# Patient Record
Sex: Female | Born: 1970
Health system: Southern US, Community
[De-identification: ages and names within clinical notes are randomized; demographics above are authoritative.]

---

## 2017-09-14 DIAGNOSIS — R001 Bradycardia, unspecified: Secondary | ICD-10-CM | POA: Diagnosis not present

## 2017-09-14 DIAGNOSIS — R0789 Other chest pain: Secondary | ICD-10-CM | POA: Diagnosis not present

## 2017-09-14 DIAGNOSIS — R002 Palpitations: Secondary | ICD-10-CM | POA: Diagnosis not present

## 2018-05-25 DIAGNOSIS — M7652 Patellar tendinitis, left knee: Secondary | ICD-10-CM | POA: Diagnosis not present

## 2018-05-25 DIAGNOSIS — M1712 Unilateral primary osteoarthritis, left knee: Secondary | ICD-10-CM | POA: Diagnosis not present

## 2018-11-05 DIAGNOSIS — Z131 Encounter for screening for diabetes mellitus: Secondary | ICD-10-CM | POA: Diagnosis not present

## 2018-11-05 DIAGNOSIS — M47814 Spondylosis without myelopathy or radiculopathy, thoracic region: Secondary | ICD-10-CM | POA: Diagnosis not present

## 2018-11-05 DIAGNOSIS — Z5320 Procedure and treatment not carried out because of patient's decision for unspecified reasons: Secondary | ICD-10-CM | POA: Diagnosis not present

## 2018-11-05 DIAGNOSIS — Z5181 Encounter for therapeutic drug level monitoring: Secondary | ICD-10-CM | POA: Diagnosis not present

## 2018-11-05 DIAGNOSIS — Z13220 Encounter for screening for lipoid disorders: Secondary | ICD-10-CM | POA: Diagnosis not present

## 2018-11-05 DIAGNOSIS — Z1159 Encounter for screening for other viral diseases: Secondary | ICD-10-CM | POA: Diagnosis not present

## 2018-11-05 DIAGNOSIS — M542 Cervicalgia: Secondary | ICD-10-CM | POA: Diagnosis not present

## 2018-11-05 DIAGNOSIS — M546 Pain in thoracic spine: Secondary | ICD-10-CM | POA: Diagnosis not present

## 2018-11-05 DIAGNOSIS — Z79899 Other long term (current) drug therapy: Secondary | ICD-10-CM | POA: Diagnosis not present

## 2018-11-05 DIAGNOSIS — M85611 Other cyst of bone, right shoulder: Secondary | ICD-10-CM | POA: Diagnosis not present

## 2018-11-11 DIAGNOSIS — M47816 Spondylosis without myelopathy or radiculopathy, lumbar region: Secondary | ICD-10-CM | POA: Diagnosis not present

## 2018-11-11 DIAGNOSIS — M9901 Segmental and somatic dysfunction of cervical region: Secondary | ICD-10-CM | POA: Diagnosis not present

## 2018-11-11 DIAGNOSIS — M7918 Myalgia, other site: Secondary | ICD-10-CM | POA: Diagnosis not present

## 2018-11-11 DIAGNOSIS — M542 Cervicalgia: Secondary | ICD-10-CM | POA: Diagnosis not present

## 2018-12-14 DIAGNOSIS — M79601 Pain in right arm: Secondary | ICD-10-CM | POA: Diagnosis not present

## 2018-12-14 DIAGNOSIS — M542 Cervicalgia: Secondary | ICD-10-CM | POA: Diagnosis not present

## 2018-12-14 DIAGNOSIS — S46001S Unspecified injury of muscle(s) and tendon(s) of the rotator cuff of right shoulder, sequela: Secondary | ICD-10-CM | POA: Diagnosis not present

## 2018-12-14 DIAGNOSIS — Z1239 Encounter for other screening for malignant neoplasm of breast: Secondary | ICD-10-CM | POA: Diagnosis not present

## 2018-12-17 DIAGNOSIS — M7541 Impingement syndrome of right shoulder: Secondary | ICD-10-CM | POA: Diagnosis not present

## 2018-12-31 DIAGNOSIS — G8929 Other chronic pain: Secondary | ICD-10-CM | POA: Diagnosis not present

## 2018-12-31 DIAGNOSIS — M542 Cervicalgia: Secondary | ICD-10-CM | POA: Diagnosis not present

## 2018-12-31 DIAGNOSIS — M25511 Pain in right shoulder: Secondary | ICD-10-CM | POA: Diagnosis not present

## 2019-04-22 DIAGNOSIS — M542 Cervicalgia: Secondary | ICD-10-CM | POA: Diagnosis not present

## 2019-04-22 DIAGNOSIS — M25511 Pain in right shoulder: Secondary | ICD-10-CM | POA: Diagnosis not present

## 2019-04-22 DIAGNOSIS — G8929 Other chronic pain: Secondary | ICD-10-CM | POA: Diagnosis not present

## 2019-06-02 ENCOUNTER — Ambulatory Visit: Payer: Self-pay | Admitting: Sports Medicine

## 2019-06-04 DIAGNOSIS — K080 Exfoliation of teeth due to systemic causes: Secondary | ICD-10-CM | POA: Diagnosis not present

## 2019-06-07 ENCOUNTER — Ambulatory Visit: Payer: Self-pay | Admitting: Family Medicine

## 2019-06-10 ENCOUNTER — Ambulatory Visit: Payer: Self-pay | Admitting: Pediatrics

## 2019-06-11 ENCOUNTER — Ambulatory Visit (INDEPENDENT_AMBULATORY_CARE_PROVIDER_SITE_OTHER): Payer: Federal, State, Local not specified - PPO | Admitting: Sports Medicine

## 2019-06-11 ENCOUNTER — Encounter: Payer: Self-pay | Admitting: Sports Medicine

## 2019-06-11 ENCOUNTER — Other Ambulatory Visit: Payer: Self-pay

## 2019-06-11 ENCOUNTER — Ambulatory Visit
Admission: RE | Admit: 2019-06-11 | Discharge: 2019-06-11 | Disposition: A | Discharge status: Home / Self Care | Payer: Federal, State, Local not specified - PPO | Source: Ambulatory Visit | Attending: Family Medicine | Admitting: Family Medicine

## 2019-06-11 VITALS — BP 128/86 | Ht 69.0 in | Wt 220.0 lb

## 2019-06-11 DIAGNOSIS — M7581 Other shoulder lesions, right shoulder: Secondary | ICD-10-CM

## 2019-06-11 DIAGNOSIS — M25561 Pain in right knee: Secondary | ICD-10-CM

## 2019-06-11 DIAGNOSIS — M25562 Pain in left knee: Secondary | ICD-10-CM

## 2019-06-11 DIAGNOSIS — M1712 Unilateral primary osteoarthritis, left knee: Secondary | ICD-10-CM | POA: Diagnosis not present

## 2019-06-11 MED ORDER — DOCUSATE SODIUM 100 MG PO CAPS: 100.0000 mg | ORAL_CAPSULE | Freq: Two times a day (BID) | ORAL | 0 refills | Status: AC | PRN

## 2019-06-11 MED ORDER — MELOXICAM 15 MG PO TABS: 15.0000 mg | ORAL_TABLET | Freq: Every day | ORAL | 2 refills | Status: DC | PRN

## 2019-06-11 NOTE — Patient Instructions (Addendum)
Your shoulder pain is caused by tendinitis of your rotator cuff -Referrals been sent to physical therapy so he can work on your shoulder strength and range of motion -I have sent a prescription for meloxicam.  Take this every day, once a day for 2 weeks.  After this you may take it daily as needed.  Do not take it with other anti-inflammatories such as ibuprofen or Aleve -If you would like a steroid injection in your shoulder in the future, please let me know.  This will help with some of the nighttime pain you are getting as well as some of the pain you are getting with movement.  Your knee pain is likely caused by arthritis of the knee.  I can see signs of some arthritis on the ultrasound -We will obtain x-rays of your knee to evaluate help at the arthritis is -You may take the meloxicam as described above -The physical therapist will also work with you for your knee pain -We can consider corticosteroid injections of your knees in the future if you desire them  I will see back in 1 month

## 2019-06-11 NOTE — Addendum Note (Signed)
Addended by: Christena Deem on: 06/11/2019 08:23 PM   Modules accepted: Orders

## 2019-06-11 NOTE — Progress Notes (Signed)
PCP: No primary care provider on file.  Subjective:   HPI: Patient is a 49 y.o. female here for evaluation of right shoulder pain and bilateral knee pain.  Patient notes right shoulder pain is been present for the last several months.  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  Patient notes the pain is in her lateral shoulder does not radiate.  Pain with reaching her arm above her head or behind her back.  Her last doctor gave her a prescription for physical therapy however she was only able to go to 1 visit before she had to move.  She recently moved from [place] to [place].  Patient is also complaining of bilateral knee pain.  Pain is located diffusely throughout the knee.  She notes pain especially with going downstairs or with doing deep squats.  She has a history of left ACL repair and left knee meniscectomy.  She denies any mechanical symptoms but notes occasionally the knee will feel unstable and will give out.  Patient denies any injury or trauma to her knees.   Review of Systems: See HPI above.  History reviewed. No pertinent past medical history.  No current outpatient medications on file prior to visit.   No current facility-administered medications on file prior to visit.    History reviewed. No pertinent surgical history.  Not on File  Social History   Socioeconomic History  . Marital status: Married    Spouse name: Not on file  . Number of children: Not on file  . Years of education: Not on file  . Highest education level: Not on file  Occupational History  . Not on file  Tobacco Use  . Smoking status: Not on file  Substance and Sexual Activity  . Alcohol use: Not on file  . Drug use: Not on file  . Sexual activity: Not on file  Other Topics Concern  . Not on file  Social History Narrative  . Not on file   Social Determinants of Health   Financial Resource Strain:   .  Difficulty of Paying Living Expenses: Not on file  Food Insecurity:   . Worried About Charity fundraiser in the Last Year: Not on file  . Ran Out of Food in the Last Year: Not on file  Transportation Needs:   . Lack of Transportation (Medical): Not on file  . Lack of Transportation (Non-Medical): Not on file  Physical Activity:   . Days of Exercise per Week: Not on file  . Minutes of Exercise per Session: Not on file  Stress:   . Feeling of Stress : Not on file  Social Connections:   . Frequency of Communication with Friends and Family: Not on file  . Frequency of Social Gatherings with Friends and Family: Not on file  . Attends Religious Services: Not on file  . Active Member of Clubs or Organizations: Not on file  . Attends Archivist Meetings: Not on file  . Marital Status: Not on file  Intimate Partner Violence:   . Fear of Current or Ex-Partner: Not on file  . Emotionally Abused: Not on file  . Physically Abused: Not on file  . Sexually Abused: Not on file    History reviewed. No pertinent family history.      Objective:  Physical Exam: There were no vitals taken for this visit. Gen: NAD, comfortable in exam room  Lungs: Breathing comfortably on room air Shoulder Exam Right -Inspection: No discoloration, no deformity -Palpation: No tenderness to palpation -ROM (active): Abduction: 180 degrees; Forward Flexion: 180 degrees; Internal Rotation: T10 -Strength: Abduction: 4/5; Forward Flexion: 5/5; Internal Rotation: 5/5; External Rotation: 5/5 -Special Tests: Hawkins: Positive; Neers: Positive; Jobs: Positive; O'briens: Negative; Speeds: Negative; Yergasons: Negative -Limb neurovascularly intact, no instability noted  Knee Exam Left -Inspection: no deformity, no discoloration -Palpation: medial joint line: Non-tender; lateral joint line: non-tender; quad tendon: non-tender; patella: non-tender; patellar tendon: non-tendon -ROM: Extension: 0 degrees; Flexion:  110 degrees -Strength: Extension: 5/5; Flexion: 5/5 -Special Tests: Varus Stress: Negative; Valgus Stress: Negative; Lachman: Negative; Posterior drawer: Negative; McMurray: Negative  Contralateral Knee -Inspection: no deformity, no discoloration -Palpation: medial joint line: Non-tender; lateral joint line: non-tender; quad tendon: non-tender; patella: non-tender; patellar tendon: non-tendon -ROM: Extension: 0 degrees; Flexion: 110 degrees -Strength: Extension: 5/5; Flexion: 5/5 -Special Tests: Varus Stress: Negative; Valgus Stress: Negative; Lachman: Negative; Posterior drawer: Negative; McMurray: Negative  Limited diagnostic ultrasound of the left knee Findings: -Normal appearance of the quadriceps and patellar tendon -Normal appearance of the patella -No fluid noted within the suprapatellar pouch -Degenerative changes seen along the medial lateral joint lines. -Normal appearance of the medial lateral meniscus Impression: -Also findings consistent with mild to moderate arthritis   Assessment & Plan:  Patient is a 49 y.o. female here for evaluation of right shoulder pain and bilateral knee pain  1.  Right rotator cuff tendinitis -Patient will be referred to physical therapy -Patient given a prescription for meloxicam.  She will take this every day for 2 weeks and then take it daily as needed after that.  She will not take other anti-inflammatories such as I Profen or Aleve with this -Patient declined steroid injection today.  She may consider this again in the future  2.  Bilateral knee osteoarthritis -We will obtain x-rays to evaluate how bad her arthritis is -Patient was offered corticosteroid injection in her knees however she declined this today -Physical therapy as above -Meloxicam as above  -Patient was given a note for work excusing her from ladders and stairs until her next visit  Patient will follow up in 1 month

## 2019-06-15 ENCOUNTER — Encounter: Payer: Self-pay | Admitting: Family Medicine

## 2019-06-15 DIAGNOSIS — R293 Abnormal posture: Secondary | ICD-10-CM | POA: Diagnosis not present

## 2019-06-15 DIAGNOSIS — M25511 Pain in right shoulder: Secondary | ICD-10-CM | POA: Diagnosis not present

## 2019-06-15 DIAGNOSIS — M25561 Pain in right knee: Secondary | ICD-10-CM | POA: Diagnosis not present

## 2019-06-15 DIAGNOSIS — M25562 Pain in left knee: Secondary | ICD-10-CM | POA: Diagnosis not present

## 2019-06-17 DIAGNOSIS — M25562 Pain in left knee: Secondary | ICD-10-CM | POA: Diagnosis not present

## 2019-06-17 DIAGNOSIS — M25561 Pain in right knee: Secondary | ICD-10-CM | POA: Diagnosis not present

## 2019-06-17 DIAGNOSIS — R293 Abnormal posture: Secondary | ICD-10-CM | POA: Diagnosis not present

## 2019-06-17 DIAGNOSIS — M25511 Pain in right shoulder: Secondary | ICD-10-CM | POA: Diagnosis not present

## 2019-06-22 DIAGNOSIS — M25561 Pain in right knee: Secondary | ICD-10-CM | POA: Diagnosis not present

## 2019-06-22 DIAGNOSIS — M25511 Pain in right shoulder: Secondary | ICD-10-CM | POA: Diagnosis not present

## 2019-06-22 DIAGNOSIS — M25562 Pain in left knee: Secondary | ICD-10-CM | POA: Diagnosis not present

## 2019-06-22 DIAGNOSIS — R293 Abnormal posture: Secondary | ICD-10-CM | POA: Diagnosis not present

## 2019-07-08 DIAGNOSIS — R293 Abnormal posture: Secondary | ICD-10-CM | POA: Diagnosis not present

## 2019-07-08 DIAGNOSIS — M25561 Pain in right knee: Secondary | ICD-10-CM | POA: Diagnosis not present

## 2019-07-08 DIAGNOSIS — M25511 Pain in right shoulder: Secondary | ICD-10-CM | POA: Diagnosis not present

## 2019-07-08 DIAGNOSIS — M25562 Pain in left knee: Secondary | ICD-10-CM | POA: Diagnosis not present

## 2019-07-14 ENCOUNTER — Ambulatory Visit: Payer: Federal, State, Local not specified - PPO | Admitting: Sports Medicine

## 2019-08-04 ENCOUNTER — Encounter: Payer: Self-pay | Admitting: Sports Medicine

## 2019-08-04 ENCOUNTER — Ambulatory Visit (INDEPENDENT_AMBULATORY_CARE_PROVIDER_SITE_OTHER): Payer: Federal, State, Local not specified - PPO | Admitting: Sports Medicine

## 2019-08-04 ENCOUNTER — Other Ambulatory Visit: Payer: Self-pay

## 2019-08-04 VITALS — BP 124/82 | Ht 69.0 in | Wt 220.0 lb

## 2019-08-04 DIAGNOSIS — M170 Bilateral primary osteoarthritis of knee: Secondary | ICD-10-CM | POA: Diagnosis not present

## 2019-08-04 DIAGNOSIS — M7581 Other shoulder lesions, right shoulder: Secondary | ICD-10-CM | POA: Diagnosis not present

## 2019-08-04 DIAGNOSIS — M7582 Other shoulder lesions, left shoulder: Secondary | ICD-10-CM

## 2019-08-04 MED ORDER — METHYLPREDNISOLONE ACETATE 40 MG/ML IJ SUSP
40.0000 mg | Freq: Once | INTRAMUSCULAR | Status: AC
  Administered 2019-08-04: 16:00:00 40 mg via INTRA_ARTICULAR

## 2019-08-04 MED ORDER — NITROGLYCERIN 0.2 MG/HR TD PT24: MEDICATED_PATCH | TRANSDERMAL | 1 refills | Status: AC

## 2019-08-04 NOTE — Addendum Note (Signed)
Addended by: Rutha Bouchard E on: 08/04/2019 04:08 PM   Modules accepted: Orders

## 2019-08-04 NOTE — Patient Instructions (Signed)
Your knee pain is caused by arthritis of your knees.  Wear the compression sleeve refitted you today on your left knee.  This will help with some of the swelling as well as provide additional support.  Work on the exercises shown to physical therapy for your knee.  Your shoulder pain is caused by tendinitis of your rotator cuff.  On ultrasound it looks like you have very small tears of your rotator cuff.  The injection given to you and in your left shoulder will take several days before it works.  Continue to do the exercises for your shoulders shown to physical therapy.  On your right shoulder you may use the nitroglycerin patches prescribed to you.  Follow protocol as outlined below.  Nitroglycerin Protocol   Apply 1/4 nitroglycerin patch to affected area daily.  Change position of patch within the affected area every 24 hours.  You may experience a headache during the first 1-2 weeks of using the patch, these should subside.  If you experience headaches after beginning nitroglycerin patch treatment, you may take your preferred over the counter pain reliever.  Another side effect of the nitroglycerin patch is skin irritation or rash related to patch adhesive.  Please notify our office if you develop more severe headaches or rash, and stop the patch.  Tendon healing with nitroglycerin patch may require 12 to 24 weeks depending on the extent of injury.  Men should not use if taking Viagra, Cialis, or Levitra.   Do not use if you have migraines or rosacea.    I will see you back in 4 to 6 weeks

## 2019-08-04 NOTE — Progress Notes (Addendum)
PCP: Patient, No Pcp Per  Subjective:   HPI: Patient is a 49 y.o. female here for follow-up of bilateral knee pain and right shoulder pain.  Patient had x-rays in her bilateral knees which showed osteoarthritis of her left knee with minimal degenerative changes of her right knee.  She also had a limited diagnostic ultrasound at her last visit showing arthritic changes in her knee.  Patient declined corticosteroid injection at her last visit and elected instead to do physical therapy.  Patient notes that her right knee is improved slightly however the left knee is still bothering her.  She denies any mechanical symptoms such as locking or swelling.  She denies any bruising.  She has no numbness or tingling.  Patient notes if anything therapy has flared up the pain in her left knee temporarily.  Patient is also diagnosed with rotator cuff tendinitis of right shoulder at her last visit.  She was referred to physical therapy for them started on meloxicam.  She declined corticosteroid injection at her last visit.  Patient notes her right shoulder is unchanged from her last visit and now her left shoulder is bothering her too.  She denies any injury or trauma.  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  Pain is located lateral shoulder does not radiate.  She notes full range of motion but does have some weakness and pain with reaching above her head and behind her back.  Patient also endorses a lot of nighttime pain.   Review of Systems: See HPI above.  History reviewed. No pertinent past medical history.  Current Outpatient Medications on File Prior to Visit  Medication Sig Dispense Refill  . docusate sodium (COLACE) 100 MG capsule Take 1 capsule (100 mg total) by mouth 2 (two) times daily as needed for mild constipation. 30 capsule 0  . gabapentin (NEURONTIN) 300 MG capsule Take 300 mg by mouth at bedtime.    .  meloxicam (MOBIC) 15 MG tablet Take 1 tablet (15 mg total) by mouth daily as needed for pain. 30 tablet 2   No current facility-administered medications on file prior to visit.    History reviewed. No pertinent surgical history.  No Known Allergies  Social History   Socioeconomic History  . Marital status: Married    Spouse name: Not on file  . Number of children: Not on file  . Years of education: Not on file  . Highest education level: Not on file  Occupational History  . Not on file  Tobacco Use  . Smoking status: Not on file  Substance and Sexual Activity  . Alcohol use: Not on file  . Drug use: Not on file  . Sexual activity: Not on file  Other Topics Concern  . Not on file  Social History Narrative  . Not on file   Social Determinants of Health   Financial Resource Strain:   . Difficulty of Paying Living Expenses:   Food Insecurity:   . Worried About Charity fundraiser in the Last Year:   . Arboriculturist in the Last Year:   Transportation Needs:   . Film/video editor (Medical):   Marland Kitchen Lack of Transportation (Non-Medical):   Physical Activity:   . Days of Exercise per Week:   . Minutes of Exercise per Session:   Stress:   . Feeling of Stress :   Social Connections:   .  Frequency of Communication with Friends and Family:   . Frequency of Social Gatherings with Friends and Family:   . Attends Religious Services:   . Active Member of Clubs or Organizations:   . Attends Banker Meetings:   Marland Kitchen Marital Status:   Intimate Partner Violence:   . Fear of Current or Ex-Partner:   . Emotionally Abused:   Marland Kitchen Physically Abused:   . Sexually Abused:     History reviewed. No pertinent family history.      Objective:  Physical Exam: There were no vitals taken for this visit. Gen: NAD, comfortable in exam room Lungs: Breathing comfortably on room air Knee Exam Left -Inspection: no deformity, no discoloration -Palpation: Minimal tenderness  palpation medial joint line -ROM: Extension: 0 degrees; Flexion: 110 degrees -Strength: Extension: 5/5; Flexion: 5/5 -Special Tests: Varus Stress: Negative; Valgus Stress: Negative; Lachman: Negative; Posterior drawer: Negative; McMurray: Negative; Thessaly: Negative; Patellar grind: Negative -Limb neurovascularly intact, no instability noted  Shoulder Exam Bilateral -Inspection: No discoloration, no deformity -Palpation: No tenderness to palpation -ROM (active): Abduction: 180 degrees; Forward Flexion: 180 degrees; Internal Rotation: T10 -Strength: Abduction: 5/5; Forward Flexion: 5/5; Internal Rotation: 5/5; External Rotation: 5/5 -Special Tests: Hawkins: Positive; Neers: Positive; Jobs: Positive; O'briens: Negative; Speeds: Negative; Yergasons: Negative; Cross arm: negative -Limb neurovascularly intact, no instability noted  Limited diagnostic ultrasound of bilateral shoulders Findings: -Normal appearance of the biceps tendons in both long and short axis bilaterally -Normal appearance of the subscapularis tendon some both shoulders -Small insertional tear of the supraspinatus of the left shoulder -Small mid substance articular sided tear of the supraspinatus of the right shoulder -Normal appearance of the infraspinatus and teres minor bilaterally -Effusion noted at the [facility] joint of the left shoulder.  This is absent on the right shoulder impression: -Ultrasound findings consistent with rotator cuff tendinitis with small partial-thickness tears of the bilateral supraspinatus tendons      Assessment & Plan:  Patient is a 49 y.o. female here for bilateral knee pain and bilateral shoulder pain  1.  Bilateral knee osteoarthritis -X-ray showing arthritis left worse than right -Patient again declined corticosteroid injection today -Patient will continue doing home exercises for her knee pain -Patient given a body helix compression sleeve for her left knee  2.  Bilateral rotator cuff  tendinitis -Ultrasound findings listed above.  Possible partial-thickness tears of the bilateral supraspinatus tendons -Patient will continue working on home exercises -Consent was obtained for corticosteroid injection of the left subacromial bursa.  Timeout was performed prior to the procedure.  Skin was cleaned and prepped using alcohol solution.  Following this 40 mg Depo-Medrol 3 cc of lidocaine were injected into the subacromial space using sterile technique.  Patient tolerated the injection well there are no complications -Prescription was sent for nitroglycerin patch to use on the right shoulder.  Patient will also use Voltaren gel for her knee pain and her shoulder pain as needed.  She will stop taking meloxicam  Patient will follow up in 4 to 6 weeks  Addendum:  I was the preceptor for this visit and available for immediate consultation.  Norton Blizzard MD Marrianne Mood

## 2019-08-05 ENCOUNTER — Other Ambulatory Visit: Payer: Self-pay

## 2019-08-05 NOTE — Progress Notes (Signed)
Pt is asking for an updated letter stating she can go back to work today instead of yesterday.

## 2019-08-28 DIAGNOSIS — R109 Unspecified abdominal pain: Secondary | ICD-10-CM | POA: Diagnosis not present

## 2019-08-28 DIAGNOSIS — R1031 Right lower quadrant pain: Secondary | ICD-10-CM | POA: Diagnosis not present

## 2019-08-28 DIAGNOSIS — Z3202 Encounter for pregnancy test, result negative: Secondary | ICD-10-CM | POA: Diagnosis not present

## 2019-08-28 DIAGNOSIS — M549 Dorsalgia, unspecified: Secondary | ICD-10-CM | POA: Diagnosis not present

## 2019-09-03 ENCOUNTER — Other Ambulatory Visit: Payer: Self-pay

## 2019-09-03 ENCOUNTER — Ambulatory Visit (INDEPENDENT_AMBULATORY_CARE_PROVIDER_SITE_OTHER): Payer: Federal, State, Local not specified - PPO | Admitting: Family Medicine

## 2019-09-03 VITALS — BP 142/92 | Ht 69.0 in | Wt 220.0 lb

## 2019-09-03 DIAGNOSIS — M7582 Other shoulder lesions, left shoulder: Secondary | ICD-10-CM | POA: Diagnosis not present

## 2019-09-03 DIAGNOSIS — M170 Bilateral primary osteoarthritis of knee: Secondary | ICD-10-CM

## 2019-09-03 DIAGNOSIS — M545 Low back pain, unspecified: Secondary | ICD-10-CM

## 2019-09-03 DIAGNOSIS — M7581 Other shoulder lesions, right shoulder: Secondary | ICD-10-CM | POA: Diagnosis not present

## 2019-09-03 MED ORDER — GABAPENTIN 300 MG PO CAPS: 300.0000 mg | ORAL_CAPSULE | Freq: Two times a day (BID) | ORAL | 3 refills | Status: DC

## 2019-09-03 NOTE — Progress Notes (Signed)
PCP: Patient, No Pcp Per  Subjective:   HPI:   49 y.o. female 49 year old female presenting for follow-up bilateral knee pain, bilateral shoulder pain, and new right-sided back pain.  Knee pain: Patient has known osteoarthritis of both knees seen on x-rays in February 2021 as well as ultrasound that showed degenerative changes in her knee.  She reports that she had ACL repair and meniscus surgery of left knee in 2010 in [place].  Since this time, she has had pain in both knees.  She has tried physical therapy in the past and has been doing knee extension exercises and has been using a foot pedal machine at home.  She denies any locking or catching.  Occasionally has swelling.  She reports that she has good days and then days when her knees flareup and really hurt.  Bilateral shoulder pain: Patient hasbeen diagnosed with bilateral rotator cuff tendinitis with partial tears of bilateral supraspinatus muscles on ultrasound in April.  She had a subacromial corticosteroid injection in her left shoulder at last visit that did not provide the relief that she hoped for.  She has not been doing the specific strengthening exercises as she has been doing the foot pedal machine with her arms and thought this would help her shoulders.  She reports pain that changes locations between her anterior, lateral, and superior shoulder. Pain is worse with overhead activities. Gabapentin seems to help.  Back pain:  Patient reports that she started to have low back pain the past two weeks.  No acute injury.  Pain gradually worsened to the point where it was painful to walk so she presented to the emergency department on 5/15.  Since she is describing flank pain, they obtained CT abdomen that was normal.  On CT, they described multilevel spondylosis and straightening of normal lumbar lordosis.  Labs were normal.  No x-rays of spine obtained.  She reports intermittent sciatic pain on her right side. No weakness. Pain is worse  with prolonged walking and with rolling over in bed. Gabapentin seems to help.   Review of Systems: See HPI above.  No past medical history on file.  Current Outpatient Medications on File Prior to Visit  Medication Sig Dispense Refill  . gabapentin (NEURONTIN) 300 MG capsule Take 300 mg by mouth at bedtime.    . docusate sodium (COLACE) 100 MG capsule Take 1 capsule (100 mg total) by mouth 2 (two) times daily as needed for mild constipation. 30 capsule 0  . meloxicam (MOBIC) 15 MG tablet Take 1 tablet (15 mg total) by mouth daily as needed for pain. 30 tablet 2  . nitroGLYCERIN (NITRODUR - DOSED IN MG/24 HR) 0.2 mg/hr patch Use 1/4 patch daily to the affected area. 30 patch 1   No current facility-administered medications on file prior to visit.    No past surgical history on file.  No Known Allergies    No family history on file.      Objective:  Physical Exam: BP (!) 142/92   Ht 5\' 9"  (1.753 m)   Wt 220 lb (99.8 kg)   BMI 32.49 kg/m  Gen: NAD, comfortable in exam room Lungs: Breathing comfortably on room air Knee Exam Left -Inspection: no deformity, no discoloration -Palpation: Minimal tenderness palpation medial joint line as well as over patella -ROM: Extension: 0 degrees; Flexion: 110 degrees -Strength: Extension: 5/5; Flexion: 5/5 -Special Tests: Varus Stress: Negative; Valgus Stress: Negative; Posterior drawer: Negative; McMurray: Negative; Thessaly: Negative; Patellar grind: Negative -Limb neurovascularly intact,  no instability noted  Right: -Inspection: no deformity, no discoloration -Palpation: Minimal tenderness palpation medial joint line. Crepitus appreciated with flexion and extension of knee -ROM: Extension: 0 degrees; Flexion: 110 degrees -Strength: Extension: 5/5; Flexion: 5/5 -Special Tests: Varus Stress: Negative; Valgus Stress: Negative; Negative; McMurray: Negative; Thessaly: Negative; Patellar grind: Negative -Limb neurovascularly intact, no  instability noted  Shoulder Exam Bilateral -Inspection: No discoloration, no deformity -Palpation: No tenderness to palpation -ROM (active): Abduction: 180 degrees; Forward Flexion: 180 degrees; Internal Rotation: T10 -Strength: Abduction: 5/5; Forward Flexion: 5/5; Internal Rotation: 5/5; External Rotation: 5/5 -Special Tests: Hawkins: Positive; Neers: Positive; Jobs: Positive; O'briens: Negative; Speeds: Negative; Yergasons: Negative; Cross arm: negative -Limb neurovascularly intact, no instability noted  Lumbar spine: - Inspection: no gross deformity or asymmetry, swelling or ecchymosis - Palpation: TTP over L5-S1 and bilateral paraspinal muscles, QL, with R>L - ROM: full active ROM of the lumbar spine in flexion and extension without pain - Strength: 5/5 strength of lower extremity in L4-S1 nerve root distributions b/l; normal gait - Neuro: sensation intact in the L4-S1 nerve root distribution b/l - Special testing: Negative straight leg raise, negative slump, negative Stork test, Negative FABER    Assessment & Plan:  Patient is a 49 y.o. female here for bilateral knee pain and bilateral shoulder pain  1.  Bilateral knee osteoarthritis.  Patient does not want corticosteroid injections.  She reports adherence with knee extension exercises. in addition, will add hip abduction strengthening as she is very weak bilaterally.  Instructed her to continue to use compression sleeve and to trial ice as needed.  2.  Bilateral rotator cuff tendinitis with partial supraspinatus tear bilaterally on ultrasound from 4/21 -no improvement with corticosteroid injection of left subacromial bursa at last appointment.  She has been doing pedaling machine with her arms rather than the home exercises provided.  Reinforced importance of strengthening rotator cuff muscles in order to help with pain reviewed exercises today again.  3. Low back pain-patient has good flexibility on exam and does not appear to have  radicular symptoms with straight leg testing or with spinal mobility testing.  She describes pain across her low back that is likely muscular in origin.  In addition she does have CT findings of spondylosis at L5-S1 that could contribute to pain.  Provided core strengthening exercises to help with back pain.  In addition we will increase her gabapentin to 300 mg twice daily as she seems to have good relief with this medication.   Patient will follow up in 4 to 6 weeks

## 2019-09-04 ENCOUNTER — Encounter: Payer: Self-pay | Admitting: Family Medicine

## 2019-09-04 NOTE — Progress Notes (Signed)
[facility]: Attending Note: I have reviewed the chart, discussed wit the Sports Medicine Fellow. I agree with assessment and treatment plan as detailed in the Fellow's note. Long conversation with her about various joint pains.  Her underlying concern is that she has something like multiple sclerosis.  I assured her I do not see any sign of that type of disease.  I think she has some chronic arthralgias that are probably amenable to stretching and strengthening program.  She is also benefited from gabapentin so we will increase that.  We did discuss the fact that gabapentin is not an opioid and therefore has little problem if use relatively long-term.  We will see her back in a month to see how this is working for her.  I think she will need encouragement for exercise program.

## 2019-10-05 DIAGNOSIS — M533 Sacrococcygeal disorders, not elsewhere classified: Secondary | ICD-10-CM | POA: Diagnosis not present

## 2019-10-15 ENCOUNTER — Ambulatory Visit (INDEPENDENT_AMBULATORY_CARE_PROVIDER_SITE_OTHER): Payer: Federal, State, Local not specified - PPO | Admitting: Family Medicine

## 2019-10-15 ENCOUNTER — Encounter: Payer: Self-pay | Admitting: Family Medicine

## 2019-10-15 ENCOUNTER — Other Ambulatory Visit: Payer: Self-pay

## 2019-10-15 VITALS — BP 124/88 | Ht 69.0 in | Wt 218.0 lb

## 2019-10-15 DIAGNOSIS — M545 Low back pain, unspecified: Secondary | ICD-10-CM | POA: Insufficient documentation

## 2019-10-15 DIAGNOSIS — M170 Bilateral primary osteoarthritis of knee: Secondary | ICD-10-CM

## 2019-10-15 DIAGNOSIS — M7581 Other shoulder lesions, right shoulder: Secondary | ICD-10-CM | POA: Diagnosis not present

## 2019-10-15 DIAGNOSIS — Z9889 Other specified postprocedural states: Secondary | ICD-10-CM | POA: Insufficient documentation

## 2019-10-15 MED ORDER — GABAPENTIN 300 MG PO CAPS: ORAL_CAPSULE | ORAL | 3 refills | Status: DC

## 2019-10-15 NOTE — Patient Instructions (Addendum)
Thank you for coming in to see [place] today! Please see below to review our plan for today's visit:  1. You can increase the gabapentin dose to 3 times daily throughout the day or 3 tablets at night before bed.  2. Please follow up in 6 weeks or sooner should your symptoms change. 3. We are referring you to Orthopedic Surgery for evaluation for surgical options.   -Dr. Luiz Blare at [facility] [place]  [phone]  -Appt: 11/18/19 @ 9:30 am. Please arrive at 9 am  -Call if you need to reschedule or want to see if there is a cancellation   Please call the clinic if your symptoms worsen or you have any concerns. It was our pleasure to serve you!   Dr. Denny Levy [facility]

## 2019-10-15 NOTE — Progress Notes (Signed)
Morgan Perkins - 49 y.o. female MRN [id number]  Date of birth: 05-28-1970    SUBJECTIVE:    This is a pleasant patient reporting to sports medicine today for follow-up for shoulder, back, and knee pains.  Chief Complaint:/ HPI:  Shoulder pain: Morgan Perkins reports that her shoulder pains are overall improved.  She recently purchased a pedaling device for her lower extremity which she often uses to work out her upper body, she reports this is really helped her shoulders.  Also reports she has felt much relief in the shoulders from gabapentin.  She had a shoulder injection on the left (posterior approach) in April 2021 reports that that did not have much effect on her pain.  She still has issues with raising her arms out to the side (abduction), but is able to lift her hands overhead with some pain.  Back pain: The back pain initially went away with low back and pelvic exercises, however she reports that about 3 weeks ago she bent forward to pick something up off the floor and it caused her back to start hurting again.  The pain is only minimally decreased since then.  She also reports that the pain also caused some pain to shoot down into her bilateral lower extremities.  Knees: Her knees have continued to cause her a great amount of discomfort and dysfunction.  She feels that her knees hurt about the same or little bit worse since the last time she was here in the office.  She denies any recent injuries or trauma to the knees.  She did have plain films taken of her knees, standing x-rays show medial joint space narrowing of the bilateral knees.  Because of her knee pain she is putting more strain on her back.  Patient had a repair of left torn ACL and meniscus in 2010, feels like this is when the majority of her pain started.   ROS:     See HPI  PERTINENT  PMH / PSH / FH / SH:  Past Medical, Surgical, Social, and Family History Reviewed & Updated in the EMR.  Pertinent findings include:  Patient  Active Problem List   Diagnosis Date Noted  . Arthritis of both knees 10/15/2019  . S/P ACL repair 10/15/2019  . Acute right-sided low back pain 10/15/2019  . Right rotator cuff tendinitis 10/15/2019    OBJECTIVE: BP 124/88   Ht 5\' 9"  (1.753 m)   Wt 218 lb (98.9 kg)   BMI 32.19 kg/m   Physical Exam:  Vital signs are reviewed.  GEN: Alert and oriented, NAD, very pleasant patient Pulm: Breathing unlabored PSY: normal mood, congruent affect MSK: Shoulders: Inspection reveals no deformity, pain appreciated with abduction to 90 degrees, normal flexion, internal/external rotation; neurovascularly intact Knees: No gross deformity appreciated upon inspection, no tenderness to palpation appreciated, full active and passive range of motion with flexion extension of the knee, strength 4/5 with flexion and extension to knees bilaterally (partially reduced secondary to pain)   ASSESSMENT & PLAN:  1.  Knee arthritis, bilaterally: Significant joint space narrowing of the bilateral knees is appreciated on x-ray. -Gabapentin 300 mg 3 times daily or 3 tablets together at night before bed. -Feel patient would benefit from orthopedic surgery consult for further management -Respectfully declined knee injection -Follow-up 6 weeks  2.  Shoulder tendinitis:  -Recommend continuing exercises with "peddler" -Gabapentin  3.  Low back pain:  -Recommend continuing exercises -Gabapentin -Avoid forward bending motions and other activities that increase pain  Morgan Shoals, DO [facility], PGY-3 10/15/2019 9:29 AM

## 2019-10-16 NOTE — Progress Notes (Signed)
[facility] Attending Note: I have seen and examined this patient. I have discussed this patient with the resident and reviewed the assessment and plan as documented above. I agree with the resident's findings and plan. Reviewed her X rays which show significant medial compartment narrowing.  I think her other issues, especially back pain, may be related so I recommend we send her for evaluation TKR (or other intervention) first. She does not want to consider cotrticosteroid injections at this time although I told her that may be the initial recommendation from the orthopedist. F/u after consult with them and continue other interventions as discussed in residnet note.

## 2019-11-19 ENCOUNTER — Ambulatory Visit: Payer: Federal, State, Local not specified - PPO | Admitting: Family Medicine

## 2019-11-19 ENCOUNTER — Other Ambulatory Visit: Payer: Self-pay

## 2019-11-19 VITALS — BP 112/78 | Ht 69.0 in | Wt 220.0 lb

## 2019-11-19 DIAGNOSIS — M170 Bilateral primary osteoarthritis of knee: Secondary | ICD-10-CM | POA: Diagnosis not present

## 2019-11-19 MED ORDER — GABAPENTIN 300 MG PO CAPS: ORAL_CAPSULE | ORAL | 3 refills | Status: DC

## 2019-11-19 NOTE — Assessment & Plan Note (Addendum)
Patient continues to have acute on chronic pain from OA of both knees with weakness of quad and hamstring supporting musculature. We discussed at length that our goal is to keep her pain within a reasonable level to where she can still remain active. If her pain continues to worsen with these conservative measures we did discuss at some point trying injections might be helpful to put off surgery and offer temporary relief - Cont with water aerobic exercises; consider using stationary bike - cont with wearing knee braces when she is active - Refilled Gabapentin today - Tylenol 3 times per day for the next 4-5 days - Voltaren gel 4 times per day for the next 4-5 days - F/u in 6 weeks

## 2019-11-19 NOTE — Progress Notes (Signed)
SUBJECTIVE:   CHIEF COMPLAINT / HPI:   Bilateral Knee Pain Morgan Perkins is a very pleasant 49 year old female who is an established patient here at [facility] sports medicine center following up today for her bilateral knee pain.  Overall, she states that her pain is about the same and her knees continue to hurt, crack, and pop and it especially difficult going up stairs or walking long distances.  She states she has been able to manage the pain reasonably with gabapentin but she has tried meloxicam in the past and she has had no benefit.  She also states that she went to physical therapy a couple of times at [facility] in [place] however she did not not like what they did so she quit going.  She has been trying to do aquatic exercises and feels like those helped some.  She has Voltaren gel but does not use it regularly.  She is still reluctant to have surgery and does not want corticosteroid injections today as she is scared of them.  We did have a very good and pleasant discussion about how to best manage her pain going forward and I did inform her that at some point the pain will most likely outweigh her fear of getting an injection and at that point she could come see [place] and we would be happy to do it.  PERTINENT  PMH / PSH: History of rotator cuff tendinitis, history of low back pain  OBJECTIVE:   BP 112/78   Ht 5\' 9"  (1.753 m)   Wt 220 lb (99.8 kg)   BMI 32.49 kg/m   MSK: Knee, Right: Inspection was negative for erythema, ecchymosis, and effusion. No obvious bony abnormalities or signs of osteophyte development. Palpation yielded no asymmetric warmth; Positive for joint line tenderness; No condyle tenderness; No patellar tenderness; Positive for patellar crepitus. Patellar and quadriceps tendons unremarkable, and no tenderness of the pes anserine bursa. No obvious Baker's cyst development. Decreased ROM in flexion but full extension (0 degrees). Normal hamstring and quadriceps strength.  Neurovascularly intact bilaterally. Special Tests  - Cruciate Ligaments:   - Anterior Drawer:  NEG - Posterior Drawer: NEG   - Varus/Valgus Stress test: NEG  - Patella:   - Patellar grind/compression: Positive  Knee, Left: Inspection was negative for erythema, ecchymosis, and effusion. No obvious bony abnormalities or signs of osteophyte development. Palpation yielded no asymmetric warmth; Positive for joint line tenderness; No condyle tenderness; No patellar tenderness; Positive for patellar crepitus. Patellar and quadriceps tendons unremarkable, and no tenderness of the pes anserine bursa. No obvious Baker's cyst development. Decreased ROM in flexion but full extension (0 degrees). Normal hamstring and quadriceps strength. Neurovascularly intact bilaterally. Special Tests  - Cruciate Ligaments:   - Anterior Drawer:  NEG - Posterior Drawer: NEG   - Varus/Valgus Stress test: NEG  - Patella:   - Patellar grind/compression: Positive  ASSESSMENT/PLAN:   Arthritis of both knees Patient continues to have acute on chronic pain from OA of both knees with weakness of quad and hamstring supporting musculature. We discussed at length that our goal is to keep her pain within a reasonable level to where she can still remain active. If her pain continues to worsen with these conservative measures we did discuss at some point trying injections might be helpful to put off surgery and offer temporary relief - Cont with water aerobic exercises; consider using stationary bike - cont with wearing knee braces when she is active - Refilled Gabapentin today -  Tylenol 3 times per day for the next 4-5 days - Voltaren gel 4 times per day for the next 4-5 days - F/u in 6 weeks     Arlyce Harman, DO PGY-4, Sports Medicine Fellow [facility]

## 2019-11-19 NOTE — Patient Instructions (Signed)
It was great to meet you today! Thank you for letting me participate in your care!  Today, we discussed your knee pain in both your knees and how to best manage your arthritis pain moving forward. I am glad the Gabapentin is working so you can continue to use it.  During acute flare ups of your arthritis please take Tylenol Extra Strength 3 times per day and use Voltaren Gel four times per day for about 4-5 days. If you do this and your pain worsens or doesn't get better please come back in to see [place].  I recommend you continue to use your knee braces during work and continue with your aquatic exercises. We will send you to a different Physical Therapy location but you need to work on increasing your strength.  Be well, Jules Schick, DO PGY-4, Sports Medicine Fellow [facility]

## 2019-11-19 NOTE — Progress Notes (Signed)
[facility]: Attending Note: I have reviewed the chart, discussed wit the Sports Medicine Fellow. I agree with assessment and treatment plan as detailed in the Fellow's note. Bilateral chronic knee pain secondary to DJD particularly in the medial compartment.  I again reviewed her films with her.  She had only been taking the gabapentin intermittently because she was worried about taking too much medicine.  Also worried that it would harm her kidneys or her liver.  We discussed the benign nature of this and the risk benefit ratio which is probably better than NSAIDs.  She has benefited from formal physical therapy in the past and would like to try that again.  She does not want to see an orthopedic surgeon at this time.  She is feels like she is not ready to consider any type of surgical intervention or injection.  Wants to continue work restrictions at work while she does PT.  See her in 6 to 8 weeks.  We will give her continue work restrictions for that time.  With the expectation that she will return to full work after formal PT and when she is back on regularly dose of gabapentin.

## 2020-01-14 ENCOUNTER — Ambulatory Visit (INDEPENDENT_AMBULATORY_CARE_PROVIDER_SITE_OTHER): Payer: Federal, State, Local not specified - PPO | Admitting: Family Medicine

## 2020-01-14 ENCOUNTER — Other Ambulatory Visit: Payer: Self-pay

## 2020-01-14 VITALS — BP 112/84 | Ht 69.0 in | Wt 220.0 lb

## 2020-01-14 DIAGNOSIS — M25511 Pain in right shoulder: Secondary | ICD-10-CM | POA: Diagnosis not present

## 2020-01-14 DIAGNOSIS — G8929 Other chronic pain: Secondary | ICD-10-CM

## 2020-01-14 DIAGNOSIS — M170 Bilateral primary osteoarthritis of knee: Secondary | ICD-10-CM

## 2020-01-14 DIAGNOSIS — M25512 Pain in left shoulder: Secondary | ICD-10-CM

## 2020-01-14 NOTE — Progress Notes (Signed)
[facility]: Attending Note: I have reviewed the chart, discussed wit the Sports Medicine Fellow. I agree with assessment and treatment plan as detailed in the Fellow's note.  

## 2020-01-14 NOTE — Progress Notes (Signed)
   Office Visit Note   Patient: Morgan Perkins           Date of Birth: 01-15-1971           MRN: [id number] Visit Date: 01/14/2020 Requested by: No referring provider defined for this encounter. PCP: Patient, No Pcp Per  Subjective: CC: Right Knee Pain  HPI: 49 year old female presenting to clinic with concerns of acute on chronic right knee pain.  Patient states that she has been struggling with this pain for several months, and has had difficulty finding a physical therapist that she is agreeable to seeing.  She has been compliant with previously prescribed medications, including Tylenol, gabapentin, Voltaren gel-with only minimal improvement.  She is not interested in injection therapy at this time.  She does state that she has started water aerobics, but does not feel as though this is actually offering any improvement to her knee pain.  She is concerned that her knee will give out on her when rising from a seated position, she denies any locking of her knee.  She states she is frustrated at her lack of improvement, and that her limping is starting to make her left side hurt.  She uses a cane to ambulate at home, but did not bring it with her today.  She denies any trauma at the worsening of her right knee pain, but does states she works at a demanding job.                ROS:   All other systems were reviewed and are negative.  Objective: Vital Signs: BP 112/84   Ht 5\' 9"  (1.753 m)   Wt 220 lb (99.8 kg)   BMI 32.49 kg/m   Physical Exam:  General:  Alert and oriented, in no acute distress. Pulm:  Breathing unlabored. Psy:  Normal mood, congruent affect. Skin: No bruising or rashes of left leg. RIGHT KNEE EXAM:  General: Normal gait Standing exam: Mild varus deformity of right knee.  Seated Exam:  Moderate patellar crepitus with knee flexion/extension.  Palpation: endorses significant tenderness with palpation of patellar tendon and patellar facets. Tenderness over medial joint  line. No lateral joint line tenderness.    Supine exam: Trace effusion, normal patellar mobility.   Ligamentous Exam:  No pain or laxity with anterior/posterior drawer.  No obvious Sag.  No pain or laxity with varus/valgus stress across the knee.   Meniscus:  McMurray with pain, but no deep clicking.   Imaging: None Today.  Assessment & Plan: 49 year old female presenting to clinic with concerns of chronic right knee pain.  Previously obtain imaging is concerning for osteoarthritic changes in the right knee, which is consistent with her examination today.  Patient is not interested in injection therapy, as she states she is afraid of needles.  She is also not interested in orthopedic surgery referral at this time.  Patient has been compliant with medication management, however she has not yet found a physical therapist that she is interested in trying. -While in exam room, patient found physical therapist that she is willing to go see.  Referral will be placed. -Follow-up in 6 weeks, if her knee pain has not improved with physical therapy. -Patient has no further questions or concerns today.     Procedures: No procedures performed  No notes on file

## 2020-07-18 NOTE — Progress Notes (Signed)
Pt called asking for a letter releasing her work restrictions.

## 2020-09-06 ENCOUNTER — Other Ambulatory Visit: Payer: Self-pay

## 2020-09-06 ENCOUNTER — Ambulatory Visit (INDEPENDENT_AMBULATORY_CARE_PROVIDER_SITE_OTHER): Payer: 59 | Admitting: Family Medicine

## 2020-09-06 VITALS — BP 108/72 | Ht 69.0 in

## 2020-09-06 DIAGNOSIS — G8929 Other chronic pain: Secondary | ICD-10-CM | POA: Diagnosis not present

## 2020-09-06 DIAGNOSIS — M25512 Pain in left shoulder: Secondary | ICD-10-CM | POA: Diagnosis not present

## 2020-09-06 DIAGNOSIS — M25511 Pain in right shoulder: Secondary | ICD-10-CM

## 2020-09-06 DIAGNOSIS — M170 Bilateral primary osteoarthritis of knee: Secondary | ICD-10-CM

## 2020-09-06 MED ORDER — GABAPENTIN 600 MG PO TABS
600.0000 mg | ORAL_TABLET | Freq: Two times a day (BID) | ORAL | 2 refills | Status: DC
Start: 2020-09-06 — End: 2021-03-23

## 2020-09-06 MED ORDER — NAPROXEN 500 MG PO TABS: 500.0000 mg | ORAL_TABLET | Freq: Two times a day (BID) | ORAL | 2 refills | Status: AC | PRN

## 2020-09-06 NOTE — Progress Notes (Signed)
PCP: Patient, No Pcp Per (Inactive)  Subjective:   HPI: Patient is a 50 y.o. female here for follow up for the below reasons.  Chronic bilateral knee pain Last week she was hyperextending her knee as she always does because she is "bowlegged" and she felt pain in her left knee. She is not able to hyperextend that knee any more. She denies hearing an audible pop but admits to swelling in the joint following the event. Has not had any PT sessions. Coworker with whom she was planning to seek location of physical therapy did not return to work due to an injury. She desires another referral for a new place that she has stored in her phone. Would like refill on gabapentin and something different than meloxicam for inflammation. Prefer 600 mg tabs of gabapentin. Meloxicam did not offer much relief.   Chronic bilateral shoulder pain Was given exercises which helped in the past. Now left is more bothersome than right. Feelings of soreness in the anterior shoulder and left side of neck. No new injuries.   No past medical history on file.  Current Outpatient Medications on File Prior to Visit  Medication Sig Dispense Refill  . docusate sodium (COLACE) 100 MG capsule Take 1 capsule (100 mg total) by mouth 2 (two) times daily as needed for mild constipation. 30 capsule 0  . gabapentin (NEURONTIN) 300 MG capsule Take 3 capsules a day by mouth 90 capsule 3  . meloxicam (MOBIC) 15 MG tablet Take 1 tablet (15 mg total) by mouth daily as needed for pain. (Patient not taking: Reported on 09/06/2020) 30 tablet 2  . nitroGLYCERIN (NITRODUR - DOSED IN MG/24 HR) 0.2 mg/hr patch Use 1/4 patch daily to the affected area. 30 patch 1   No current facility-administered medications on file prior to visit.    No past surgical history on file.  No Known Allergies  Social History   Socioeconomic History  . Marital status: Married    Spouse name: Not on file  . Number of children: Not on file  . Years of education:  Not on file  . Highest education level: Not on file  Occupational History  . Not on file  Tobacco Use  . Smoking status: Not on file  . Smokeless tobacco: Not on file  Substance and Sexual Activity  . Alcohol use: Not on file  . Drug use: Not on file  . Sexual activity: Not on file  Other Topics Concern  . Not on file  Social History Narrative  . Not on file   Social Determinants of Health   Financial Resource Strain: Not on file  Food Insecurity: Not on file  Transportation Needs: Not on file  Physical Activity: Not on file  Stress: Not on file  Social Connections: Not on file  Intimate Partner Violence: Not on file    No family history on file.  BP 108/72   Ht 5\' 9"  (1.753 m)   BMI 32.49 kg/m   No flowsheet data found.  No flowsheet data found.  Review of Systems: See HPI above.     Objective:  Physical Exam:  Gen: NAD, comfortable in exam room  Bilateral knee: No gross deformity, ecchymoses, swelling. Globally TTP. FROM with normal strength. Negative ant/post drawers. Negative valgus/varus testing. Negative lachman. Negative mcmurrays, apleys. NV intact distally.  Left shoulder: No swelling, ecchymoses.  No gross deformity. TTP over trap. FROM. Negative Hawkins, Neers. Strength 5/5 with empty can and resisted internal/external rotation. NV intact  distally.   Assessment & Plan:  1. Chronic bilateral knee pain Known arthritic changes. Physical exam unremarkable for an acute process. As previously states patient to benefit from PT. Will attempt to allow patient to get connected with her choice of place.   2. Chronic shoulder pain No acute findings. PT/home exercises beneficial in the past. Desired by patient to return to PT.   Plan: -PT referral -Start gabapentin 600 mg BID -Start naproxen 500 mg BID   Resident Attestation   I saw and evaluated the patient, performing the key elements of the service.I  personally performed or re-performed  the history, physical exam, and medical decision making activities of this service and have verified that the service and findings are accurately documented in the resident's note. I developed the management plan that is described in the resident's note, and I agree with the content, with my edits above.    Jules Schick, DO [facility], PGY-4  Addendum:  I was the preceptor for this visit and available for immediate consultation.  Norton Blizzard MD Marrianne Mood

## 2020-09-06 NOTE — Patient Instructions (Signed)
It was great to meet you today! Thank you for letting me participate in your care!  Today, we discussed your chronic bilateral knee pain. I am sending in Naprosyn and increasing your Gabapentin. Please let us know the name of the desired physical therapy location and we will give you a prescription for your knees and shoulders.  Follow up with [place] as needed.  Be well, Jules Schick, DO PGY-4, Sports Medicine Fellow [facility]

## 2021-03-23 ENCOUNTER — Ambulatory Visit
Admission: RE | Admit: 2021-03-23 | Discharge: 2021-03-23 | Disposition: A | Discharge status: Home / Self Care | Payer: 59 | Source: Ambulatory Visit | Attending: Family Medicine | Admitting: Family Medicine

## 2021-03-23 ENCOUNTER — Ambulatory Visit (INDEPENDENT_AMBULATORY_CARE_PROVIDER_SITE_OTHER): Payer: 59 | Admitting: Family Medicine

## 2021-03-23 VITALS — BP 150/77 | Ht 69.0 in

## 2021-03-23 DIAGNOSIS — M25512 Pain in left shoulder: Secondary | ICD-10-CM

## 2021-03-23 DIAGNOSIS — M170 Bilateral primary osteoarthritis of knee: Secondary | ICD-10-CM

## 2021-03-23 DIAGNOSIS — G8929 Other chronic pain: Secondary | ICD-10-CM | POA: Insufficient documentation

## 2021-03-23 DIAGNOSIS — M25511 Pain in right shoulder: Secondary | ICD-10-CM | POA: Diagnosis not present

## 2021-03-23 DIAGNOSIS — M542 Cervicalgia: Secondary | ICD-10-CM | POA: Diagnosis not present

## 2021-03-23 MED ORDER — GABAPENTIN 300 MG PO CAPS
ORAL_CAPSULE | ORAL | 3 refills | Status: AC
Start: 2021-03-23 — End: ?

## 2021-03-23 NOTE — Assessment & Plan Note (Addendum)
No improvement with prior subacromial injection.  Reassured by her preserved passive range of motion and strength.  She has had prior supraspinatus tears, but no specific pain with supraspinatus or rotator cuff testing.  Less likely OA given her excellent range of motion.  Gabapentin per above and aquatic therapy per above.  Follow-up in 6 to 8 weeks.

## 2021-03-23 NOTE — Progress Notes (Signed)
Marquesha Trombetta is a 50 y.o. female who presents to [facility] today for the following:  Chronic bilateral knee pain follow-up Last seen for the same on 09/06/2020 Has been to PT for this, still doing home exercises Continues have pain, mostly at the medial joint line bilaterally She continues to want to avoid surgery and injections States that she did aquatic therapy when she was in [place] and felt that this was very helpful for her pain Does step ups, step down, quad strengthening and hip abduction exercises at home  Chronic bilateral shoulder pain follow-up Also seen for the same on 5/25 Was referred to physical therapy and given prescription for naproxen She also takes gabapentin for her chronic pain Prior US from 07/2019 with b/l partial supraspinatus tears Left>right pain RHD Feels like some of the exercises causes pain, so she does not do them Does do some pendulum swings, but resistance bands hurt Feels that the gabapentin helps her pain, but when she takes this is 100 mg twice daily it makes her sleepy when she is working Has been out of gabapentin for 2 weeks  Neck Pain Started over the last few months Worsening with shoulder pain worsening Now working at a desk and thinks this makes the pain worse No N/T in arms/hands  PMH reviewed.  ROS as above. Medications reviewed.  Exam:  BP (!) 150/77   Ht 5\' 9"  (1.753 m)   BMI 32.49 kg/m  Gen: Well NAD MSK:  Bilateral knees: - Inspection: no gross deformity b/l. No swelling/effusion, erythema or bruising b/l. Skin intact - Palpation: Tenderness palpation at the bilateral medial joint lines - ROM: full active ROM with flexion and extension in knee and hip b/l - Strength: 5/5 strength b/l - Neuro/vasc: NV intact distally b/l - Special Tests: - LIGAMENTS: negative anterior and posterior drawer,  no MCL or LCL laxity  -- MENISCUS: Positive McMurray's bilaterally -- PF JOINT: nml patellar mobility bilaterally  Hips: normal  ROM  Bilateral Shoulders: Inspection reveals no obvious deformity, atrophy, or asymmetry b/l. No bruising. No swelling Palpation is normal with no TTP over [facility] joint or bicipital groove b/l. Full passive ROM in flexion, abduction, internal/external rotation b/l, active limited 2/2 pain in all fields NV intact distally b/l Normal scapular function observed b/l Special Tests:  - Impingement: Neg Hawkins, neers, empty can sign. - Supraspinatous: Negative empty can - Infraspinatous/Teres Minor: 5/5 strength with ER - Subscapularis: 5/5 strength with IR - Biceps tendon: Negative Speeds, Yerrgason's  - Labrum: Negative Obriens, negative clunk, good stability - AC Joint: Negative cross arm - Negative apprehension test - No painful arc and no drop arm sign  Neck/Back: - Inspection: no gross deformity or asymmetry, swelling or ecchymosis - Palpation: No TTP spinous process, there is mild diffuseTTP rhomboid and trapezius without specific tender point - ROM: full active ROM of the cervical spine with neck extension, rotation, flexion - pain in all directions - Strength: 5/5 wrist flexion, extension, biceps flexion, triceps extension. OK sign, interosseus strength intact  - Neuro: sensation intact in the C5-C8 nerve root distribution b/l - Special testing: positive spurling's on right      No results found.   Assessment and Plan: 1) Primary osteoarthritis of both knees She would like to avoid needles, therefore declines injection.  Also declines surgical referral.  Recommend continue with home exercise program and prescription also provided for aquatic therapy.  Discussed options for her pain control including changing dosage and timing of gabapentin as  well as switching to Cymbalta given her diffuse pain, however she would like to start with changing the way that she takes gabapentin first.  If this is not doing well, she will consider starting Cymbalta.  We will change gabapentin to 4  tablets when she sleeps, as she does work graveyard shift.  Also discussed that she could take 300-300-600 dosages throughout the day.  Follow-up in 6 to 8 weeks.  Chronic pain of both shoulders No improvement with prior subacromial injection.  Reassured by her preserved passive range of motion and strength.  She has had prior supraspinatus tears, but no specific pain with supraspinatus or rotator cuff testing.  Less likely OA given her excellent range of motion.  Gabapentin per above and aquatic therapy per above.  Follow-up in 6 to 8 weeks.  Neck pain Likely related to compensation for her shoulder pain and work.  Given cervical spine range of motion exercises and ordered C-spine 2 view films to assess degenerative changes if any.  Reassured that she is neurovascularly intact distally.  Gabapentin per above.   Luis Abed, D.O.  PGY-4 [facility]  03/23/2021 11:04 AM

## 2021-03-23 NOTE — Assessment & Plan Note (Signed)
She would like to avoid needles, therefore declines injection.  Also declines surgical referral.  Recommend continue with home exercise program and prescription also provided for aquatic therapy.  Discussed options for her pain control including changing dosage and timing of gabapentin as well as switching to Cymbalta given her diffuse pain, however she would like to start with changing the way that she takes gabapentin first.  If this is not doing well, she will consider starting Cymbalta.  We will change gabapentin to 4 tablets when she sleeps, as she does work graveyard shift.  Also discussed that she could take 300-300-600 dosages throughout the day.  Follow-up in 6 to 8 weeks.

## 2021-03-23 NOTE — Patient Instructions (Signed)
Thank you for coming to see me today. It was a pleasure. Today we talked about:   We have placed a referral to aquatic therapy for you.  Please call to schedule this appointment.    I have placed an order for an x-ray of your neck.  Please go to [facility] to have this completed.  You do not need an appointment.  We will send you a letter with your results afterwards.   We have refilled your gabapentin.  You can take all 4 of the pills before you go to bed if it makes you sleepy.   Please follow-up with [place] in 6-8 weeks.  If you have any questions or concerns, please do not hesitate to call the office at [phone].  Best,   Luis Abed, DO [facility]

## 2021-03-23 NOTE — Assessment & Plan Note (Signed)
Likely related to compensation for her shoulder pain and work.  Given cervical spine range of motion exercises and ordered C-spine 2 view films to assess degenerative changes if any.  Reassured that she is neurovascularly intact distally.  Gabapentin per above.

## 2021-03-26 ENCOUNTER — Encounter: Payer: Self-pay | Admitting: Family Medicine

## 2021-03-26 NOTE — Progress Notes (Signed)
[facility]: Attending Note: I have reviewed the chart, discussed wit the Sports Medicine Fellow. I agree with assessment and treatment plan as detailed in the Fellow's note.  

## 2021-03-29 ENCOUNTER — Encounter: Payer: Self-pay | Admitting: Physical Therapy

## 2021-03-29 ENCOUNTER — Ambulatory Visit: Payer: 59 | Attending: Family Medicine | Admitting: Physical Therapy

## 2021-03-29 ENCOUNTER — Other Ambulatory Visit: Payer: Self-pay

## 2021-03-29 DIAGNOSIS — M25512 Pain in left shoulder: Secondary | ICD-10-CM | POA: Diagnosis present

## 2021-03-29 DIAGNOSIS — M170 Bilateral primary osteoarthritis of knee: Secondary | ICD-10-CM | POA: Diagnosis not present

## 2021-03-29 DIAGNOSIS — M25562 Pain in left knee: Secondary | ICD-10-CM | POA: Diagnosis not present

## 2021-03-29 DIAGNOSIS — M6281 Muscle weakness (generalized): Secondary | ICD-10-CM | POA: Insufficient documentation

## 2021-03-29 DIAGNOSIS — G8929 Other chronic pain: Secondary | ICD-10-CM | POA: Diagnosis present

## 2021-03-29 DIAGNOSIS — M25511 Pain in right shoulder: Secondary | ICD-10-CM | POA: Insufficient documentation

## 2021-03-29 DIAGNOSIS — M542 Cervicalgia: Secondary | ICD-10-CM | POA: Diagnosis present

## 2021-03-29 DIAGNOSIS — R2689 Other abnormalities of gait and mobility: Secondary | ICD-10-CM | POA: Diagnosis present

## 2021-03-29 DIAGNOSIS — M25561 Pain in right knee: Secondary | ICD-10-CM | POA: Diagnosis present

## 2021-03-29 NOTE — Therapy (Signed)
[facility] [address] [place], [place], [address] Phone: [phone]   Fax:  [phone]  Physical Therapy Evaluation  Patient Details  Name: Maryjane Benedict MRN: [id number] Date of Birth: 1970/09/18 Referring Provider (PT): Nestor Ramp, MD   Encounter Date: 03/29/2021   PT End of Session - 03/29/21 1332     Visit Number 1    Number of Visits 60    Date for PT Re-Evaluation 06/07/21    Authorization Type U[employer]    Authorization Time Period 60 visit limit annually    Authorization - Visit Number 1    Authorization - Number of Visits 60    PT Start Time 1145    PT Stop Time 1230    PT Time Calculation (min) 45 min    Activity Tolerance Patient limited by pain    Behavior During Therapy [facility] for tasks assessed/performed             History reviewed. No pertinent past medical history.  History reviewed. No pertinent surgical history.  There were no vitals filed for this visit.    Subjective Assessment - 03/29/21 1147     Subjective Pt is referred to OPPT for aquatic PT to address chronic bil shoulder, bil knee, and neck pain.  Neck pain started more recently and may be secondary to compensation from bil shoulder pain.  Pt has had PT in the past and has HEP for pain locations.  Referring MD wanted to add aquatic PT into the mix - Pt had aquatic PT in the past which really helped.    Pertinent History PSH: ACL repair Lt knee 2015    Limitations Standing;Walking;House hold activities;Lifting    How long can you stand comfortably? 2 hour - knees    How long can you walk comfortably? 1 hour - knees    Diagnostic tests cspine xray 2022: DDD C5-C7, reversal of lordosis, 2021 Lt knee medial compartment degeneration (Hx of ACL repair), Rt knee negative, 2021 bil shoulder [place]: partial supraspinatus tears    Patient Stated Goals gain more strength, not flop when I go to sit down, be able to stand up without using hands     Currently in Pain? Yes    Pain Score 10-Worst pain ever   at it's worst, never a 0/10   Pain Location Knee    Pain Orientation Right;Left;Medial;Anterior    Pain Descriptors / Indicators Aching;Tightness;Sharp    Pain Type Chronic pain    Pain Onset More than a month ago    Pain Frequency Constant    Aggravating Factors  walking, standing, transitions in/out of chair, sitting too long they tighten up    Pain Relieving Factors unsure    Effect of Pain on Daily Activities transfers, walking and standing tolerance    Multiple Pain Sites Yes    Pain Score 8    Pain Location Shoulder    Pain Orientation Right;Left;Anterior;Lateral    Pain Descriptors / Indicators Sharp;Aching;Tiring    Pain Type Chronic pain    Pain Radiating Towards hands go numb at night, intrascapular pain    Pain Onset More than a month ago    Pain Frequency Constant    Aggravating Factors  lifting overhead, reaching overhead, dressing behind back or reaching to backseat of car    Pain Relieving Factors unsure    Pain Score 10   ranges form 1-10   Pain Location Neck    Pain Orientation Right;Left;Mid;Upper;Lower;Posterior  Pain Descriptors / Indicators Sharp;Aching;Tightness    Pain Type Chronic pain    Pain Radiating Towards from base of skull to intrascapular region bil    Pain Onset More than a month ago    Pain Frequency Intermittent    Aggravating Factors  neck hurts when shoulders hurt more    Pain Relieving Factors unsure                [facility] PT Assessment - 03/29/21 0001       Assessment   Medical Diagnosis M17.0 (ICD-10-CM) - Primary osteoarthritis of both knees  M25.511,G89.29,M25.512 (ICD-10-CM) - Chronic pain of both shoulders  M54.2 (ICD-10-CM) - Neck pain    Referring Provider (PT) Nestor Ramp, MD    Hand Dominance Right    Next MD Visit 6-8 weeks    Prior Therapy yes, including aquatic PT, has HEP from past PT      Precautions   Precaution Comments has an acute Lt ankle sprain,  using SPC since ankle sprain on 03/26/21      Restrictions   Weight Bearing Restrictions No      Balance Screen   Has the patient fallen in the past 6 months No    Has the patient had a decrease in activity level because of a fear of falling?  No    Is the patient reluctant to leave their home because of a fear of falling?  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, forklift, lifts heavy boxes      Cognition   Overall Cognitive Status Within Functional Limits for tasks assessed      Posture/Postural Control   Posture/Postural Control Postural limitations    Postural Limitations Decreased thoracic kyphosis;Increased lumbar lordosis;Rounded Shoulders;Forward head    Posture Comments scapular winging Rt>Lt      ROM / Strength   AROM / PROM / Strength AROM;Strength      AROM   Overall AROM Comments bil shoulders WFL with exception of functional IR to L4, pain    AROM Assessment Site Cervical;Knee;Shoulder    Cervical Flexion 55    Cervical Extension 30    Cervical - Right Side Bend 20    Cervical - Left Side Bend 25    Cervical - Right Rotation 60    Cervical - Left Rotation 50      Strength   Overall Strength Comments bil shoulders and elbows 3+/5, bil knees and hips 3+/5, pain on resistance testing    Strength Assessment Site Hand    Right/Left hand Right;Left    Right Hand Grip (lbs) 64    Left Hand Grip (lbs) 53      Transfers   Transfers Sit to Stand;Stand to Sit    Sit to  Stand With upper extremity assist    Stand to Sit Uncontrolled descent      Ambulation/Gait  Ambulation/Gait Yes    Ambulation/Gait Assistance 6: Modified independent (Device/Increase time)    Assistive device Straight cane      Balance   Balance Assessed Yes      Standardized Balance Assessment   Standardized Balance Assessment Five Times Sit to Stand    Five times sit to stand comments  28 sec, heavy use of UEs on arm rests, uncontrolled descent                        Objective measurements completed on examination: See above findings.                  PT Short Term Goals - 03/29/21 1402       PT SHORT TERM GOAL #1   Title Pt will demo consistent work rate throughout aquatic session with minimal need for breaks.    Time 6    Period Weeks    Status New    Target Date 05/10/21      PT SHORT TERM GOAL #2   Title Pt will participate in 6' walk test in pool to establish baseline for distance and endurance.    Time 4    Period Weeks    Status New    Target Date 04/26/21               PT Long Term Goals - 03/29/21 1335       PT LONG TERM GOAL #1   Title Pt will improve bil UE and LE strength to at least 4/5 for improved task performance at home and work    Baseline 3+/5    Time 10    Period Weeks    Status New    Target Date 06/07/21      PT LONG TERM GOAL #2   Title Improved 5x sit to stand with UE support as needed in </= 20 sec with improved symmetry of bil LEs and control with descent.    Baseline 28", heavy use of UEs    Time 10    Period Weeks    Status New    Target Date 06/07/21      PT LONG TERM GOAL #3   Title Pt will be able to demo up/down stairs + 1 railing with good control and minimal pain for ease of home access (has 5 stairs with 1 rail)    Time 10    Period Weeks    Status New    Target Date 06/07/21      PT LONG TERM GOAL #4   Title Pt will report pain not to exceed 5/10 with standing and walking tasks  up to 2 hours.    Time 10    Period Weeks    Status New    Target Date 06/07/21      PT LONG TERM GOAL #5   Title Pt will be able to participate in a 6' walk test in the pool using symmetrical gait pattern.  Goal may be updated to reflect target number of laps to cover in 6' from baseline.    Time 10    Period Weeks    Status New    Target Date 06/07/21                    Plan - 03/29/21 1349     Clinical Impression Statement Pt is referred to OPPT for evaluation of chronic bil knee, bil shoulder, and neck pain.  Referring MD specifically requests aquatic PT due to Pt's success with this in the past.  Pt has diagnosed bil partial supraspinatus tears and Lt medial knee degeneration, and DDD C5-C7.  Additionally, Pt has acute Lt ankle sprain for which she is in a brace and using a SPC today having rolled ankle 3 days ago.  Prior to ankle sprain Pt reports she does not use AD.  Pt ambulates with Lt antalgic gait with SPC in Rt UE.  She uses UEs for sit to stand and has uncontrolled descent with stand to sit.  5x sit to stand is 28" with pain in neck due to UE weight bearing to assist.  Strength in bil UEs and LEs is 3/5 to 3+/5, with pain on testing bil shoulders.  Pt works in [employer] for [employer] and does variety of tasks including desk work, Chief Executive Officer driving, and bending/lifting.  She is currently off work x 2 days due to acute ankle sprain.  She lives alone and has 5 stairs + single Rt rail to enter home where she lives on main level.  Pt has limited and painful cervical ROM in all planes.  ROM is grossly WNL for bil shoulders and knees, with pain in Lt>Rt knee and bil shoulders with overhead and behind the back positioning.  She will benefit from aquatic PT 2x/week for 6-8 weeks to address weakness, improve mobility and endurance, and reduce pain using benefits of water-based therapy.    Personal Factors and Comorbidities Fitness;Time since onset of  injury/illness/exacerbation;Profession    Examination-Activity Limitations Locomotion Level;Transfers;Reach Overhead;Bend;Stand;Dressing;Lift;Squat    Examination-Participation Restrictions Community Activity;Occupation;Laundry;Shop;Cleaning;Meal Prep;Driving    Stability/Clinical Decision Making Evolving/Moderate complexity    Clinical Decision Making Moderate    Rehab Potential Good    PT Frequency 2x / week    PT Duration Other (comment)   10 weeks, long cert period secondary to delayed start for aquatic PT due to lack of available appointments until Apr 25, 2021   PT Treatment/Interventions ADLs/Self Care Home Management;Aquatic Therapy;Energy conservation;Therapeutic exercise;Neuromuscular re-education;Gait training;Stair training;Therapeutic activities;Functional mobility training;Patient/family education    PT Next Visit Plan establish baseline of 6' walk test in pool (update LTG), LE/UE strength, cervical ROM    PT Home Exercise Plan aquatic info given    Consulted and Agree with Plan of Care Patient             Patient will benefit from skilled therapeutic intervention in order to improve the following deficits and impairments:  Abnormal gait, Pain, Impaired UE functional use, Decreased endurance, Postural dysfunction, Decreased strength, Decreased mobility, Decreased activity tolerance, Decreased range of motion  Visit Diagnosis: Chronic pain of left knee - Plan: PT plan of care cert/re-cert  Chronic pain of right knee - Plan: PT plan of care cert/re-cert  Chronic left shoulder pain - Plan: PT plan of care cert/re-cert  Chronic right shoulder pain - Plan: PT plan of care cert/re-cert  Cervicalgia - Plan: PT plan of care cert/re-cert  Muscle weakness (generalized) - Plan: PT plan of care cert/re-cert  Other abnormalities of gait and mobility - Plan: PT plan of care cert/re-cert     Problem List Patient Active Problem List   Diagnosis Date Noted   Chronic pain of  both shoulders 03/23/2021   Neck pain 03/23/2021   Primary osteoarthritis of both knees 10/15/2019   S/P ACL repair 10/15/2019   Acute right-sided low back pain 10/15/2019   Right rotator cuff tendinitis 10/15/2019    Renad Jenniges, PT 03/29/21  2:05 PM    [facility] @ [facility] [address] [place], [place], [address] Phone: [phone]   Fax:  [phone]  Name: Elfie Costanza MRN: [id number] Date of Birth: [birth year]

## 2021-03-29 NOTE — Patient Instructions (Signed)
° ° ° °  [facility] Welcome to [facility]! Here you will find all the information you will need regarding your pool therapy. If you have further questions at any time, please call our office at [phone]. After completing your initial evaluation in the [facility] clinic, you may be eligible to complete a portion of your therapy in the pool. A typical week of therapy will consist of 1-2 typical physical therapy visits at our [facility] location and an additional session of therapy in the pool located at the [facility] at [address]. [address], [place] [address]. The phone number at the pool site is [phone]. Please call this number if you are running late or need to cancel your appointment.  Aquatic therapy will be offered on Wednesday mornings and Friday afternoons. Each session will last approximately 45 minutes. All scheduling and payments for aquatic therapy sessions, including cancelations, will be done through our [facility] location.  To be eligible for aquatic therapy, these criteria must be met: You must be able to independently change in the locker room and get to the pool deck. A caregiver can come with you to help if needed. There are benches for a caregiver to sit on next to the pool. No one with an open wound is permitted in the pool.  Handicap parking is available in the front and there is a drop off option for even closer accessibility. Please arrive 15 minutes prior to your appointment to prepare for your pool session. You must sign in at the front desk upon your arrival. Please be sure to attend to any toileting needs prior to entering the pool. Locker rooms for changing are available.  There is direct access to the pool deck from the locker room. You can lock your belongings in a locker or bring them with you poolside. Your therapist will greet you on the pool deck. There may be other swimmers in the pool at the  same time but your session is one-on-one with the therapist.   

## 2021-04-25 ENCOUNTER — Ambulatory Visit: Payer: 59 | Attending: Family Medicine | Admitting: Physical Therapy

## 2021-04-25 ENCOUNTER — Other Ambulatory Visit: Payer: Self-pay

## 2021-04-25 DIAGNOSIS — G8929 Other chronic pain: Secondary | ICD-10-CM | POA: Diagnosis present

## 2021-04-25 DIAGNOSIS — R2689 Other abnormalities of gait and mobility: Secondary | ICD-10-CM | POA: Diagnosis present

## 2021-04-25 DIAGNOSIS — M25562 Pain in left knee: Secondary | ICD-10-CM | POA: Diagnosis present

## 2021-04-25 DIAGNOSIS — M25561 Pain in right knee: Secondary | ICD-10-CM | POA: Insufficient documentation

## 2021-04-25 DIAGNOSIS — M6281 Muscle weakness (generalized): Secondary | ICD-10-CM | POA: Insufficient documentation

## 2021-04-25 DIAGNOSIS — M542 Cervicalgia: Secondary | ICD-10-CM | POA: Insufficient documentation

## 2021-04-25 DIAGNOSIS — M25511 Pain in right shoulder: Secondary | ICD-10-CM | POA: Diagnosis present

## 2021-04-25 DIAGNOSIS — M25512 Pain in left shoulder: Secondary | ICD-10-CM | POA: Insufficient documentation

## 2021-04-25 NOTE — Therapy (Signed)
[facility] @ [place], [place], [address] Phone: [phone]   Fax:  [phone]  Physical Therapy Treatment  Patient Details  Name: Morgan Perkins MRN: [id number] Date of Birth: 06-01-1970 Referring Provider (PT): Dickie La, MD   Encounter Date: 04/25/2021   PT End of Session - 04/25/21 1027     Visit Number 2    Number of Visits 60    Authorization Type UHC    Authorization Time Period 60 visit limit annually    Authorization - Visit Number 2    Authorization - Number of Visits 60    PT Start Time 1027   pt late   PT Stop Time 1101    PT Time Calculation (min) 34 min    Activity Tolerance Patient tolerated treatment well    Behavior During Therapy [facility] for tasks assessed/performed             No past medical history on file.  No past surgical history on file.  There were no vitals filed for this visit.   Subjective Assessment - 04/25/21 1027     Subjective Pt almost 15 min late, went to wrong facility.             Treatment: Patient seen for aquatic therapy today.  Treatment took place in water .35-4.5 feet deep depending upon activity.  Pt entered the pool via stairs with mild use of rails. Water temp 93 degreesF, Pt requires buoyancy of water for support and to offload joints with strengthening exercises.    Seated water bench with 75% submersion Pt performed seated LE AROM exercises 20x in all planes, concurrent discussion of current status.   Water walking 75% submersion: 4x forward/back, 4x side stepping. Hip circumduction 10x in each direction Bil,  core compressions with neck pillow 5 sec hold 10x, attempted seated decompression but pt could not control her LE: pt could tolerate Horizontal decompression float with 100% flotation and PTA providing lateral sway to mobilize trunk.                              PT Short Term Goals - 03/29/21 1402       PT SHORT  TERM GOAL #1   Title Pt will demo consistent work rate throughout aquatic session with minimal need for breaks.    Time 6    Period Weeks    Status New    Target Date 05/10/21      PT SHORT TERM GOAL #2   Title Pt will participate in 6' walk test in pool to establish baseline for distance and endurance.    Time 4    Period Weeks    Status New    Target Date 04/26/21               PT Long Term Goals - 03/29/21 1335       PT LONG TERM GOAL #1   Title Pt will improve bil UE and LE strength to at least 4/5 for improved task performance at home and work    Baseline 3+/5    Time 10    Period Weeks    Status New    Target Date 06/07/21      PT LONG TERM GOAL #2   Title Improved 5x sit to stand with UE support as needed in </= 20 sec with improved symmetry of bil LEs and control  with descent.    Baseline 28", heavy use of UEs    Time 10    Period Weeks    Status New    Target Date 06/07/21      PT LONG TERM GOAL #3   Title Pt will be able to demo up/down stairs + 1 railing with good control and minimal pain for ease of home access (has 5 stairs with 1 rail)    Time 10    Period Weeks    Status New    Target Date 06/07/21      PT LONG TERM GOAL #4   Title Pt will report pain not to exceed 5/10 with standing and walking tasks up to 2 hours.    Time 10    Period Weeks    Status New    Target Date 06/07/21      PT LONG TERM GOAL #5   Title Pt will be able to participate in a 6' walk test in the pool using symmetrical gait pattern.  Goal may be updated to reflect target number of laps to cover in 6' from baseline.    Time 10    Period Weeks    Status New    Target Date 06/07/21                   Plan - 04/25/21 1553     Clinical Impression Statement Pt arrives 15 min late, she went to wrong facility. She denies current pain and is quite enthousiastic about aquatic exercise. She tolerated all basic exercises well, reporting everything felt "good." Pt has  previous water exercise experience.    Personal Factors and Comorbidities Fitness;Time since onset of injury/illness/exacerbation;Profession    Examination-Activity Limitations Locomotion Level;Transfers;Reach Overhead;Bend;Stand;Dressing;Lift;Squat    Examination-Participation Restrictions Community Activity;Occupation;Laundry;Shop;Cleaning;Meal Prep;Driving    Stability/Clinical Decision Making Evolving/Moderate complexity    Rehab Potential Good    PT Frequency 2x / week    PT Duration Other (comment)    PT Treatment/Interventions ADLs/Self Care Home Management;Aquatic Therapy;Energy conservation;Therapeutic exercise;Neuromuscular re-education;Gait training;Stair training;Therapeutic activities;Functional mobility training;Patient/family education    PT Next Visit Plan establish baseline of 6' walk test in pool (update LTG), LE/UE strength, cervical ROM    Consulted and Agree with Plan of Care Patient             Patient will benefit from skilled therapeutic intervention in order to improve the following deficits and impairments:  Abnormal gait, Pain, Impaired UE functional use, Decreased endurance, Postural dysfunction, Decreased strength, Decreased mobility, Decreased activity tolerance, Decreased range of motion  Visit Diagnosis: Chronic pain of left knee  Chronic pain of right knee  Chronic left shoulder pain  Chronic right shoulder pain  Cervicalgia  Muscle weakness (generalized)  Other abnormalities of gait and mobility     Problem List Patient Active Problem List   Diagnosis Date Noted   Chronic pain of both shoulders 03/23/2021   Neck pain 03/23/2021   Primary osteoarthritis of both knees 10/15/2019   S/P ACL repair 10/15/2019   Acute right-sided low back pain 10/15/2019   Right rotator cuff tendinitis 10/15/2019    Taevion Sikora, PTA 04/25/2021, 3:57 PM  [place] @ [place], [place], [address] Phone: [phone]   Fax:  [phone]  Name: Morgan Perkins MRN: [id number] Date of Birth: [birth year]

## 2021-04-27 ENCOUNTER — Other Ambulatory Visit: Payer: Self-pay

## 2021-04-27 ENCOUNTER — Ambulatory Visit: Payer: 59 | Admitting: Physical Therapy

## 2021-04-27 DIAGNOSIS — R2689 Other abnormalities of gait and mobility: Secondary | ICD-10-CM

## 2021-04-27 DIAGNOSIS — G8929 Other chronic pain: Secondary | ICD-10-CM

## 2021-04-27 DIAGNOSIS — M25562 Pain in left knee: Secondary | ICD-10-CM

## 2021-04-27 DIAGNOSIS — M542 Cervicalgia: Secondary | ICD-10-CM

## 2021-04-27 DIAGNOSIS — M6281 Muscle weakness (generalized): Secondary | ICD-10-CM

## 2021-04-27 NOTE — Therapy (Signed)
[facility] @ [place] [address] [place], [place], [address] Phone: [phone]   Fax:  [phone]  Physical Therapy Treatment  Patient Details  Name: Morgan SprungDeshun Current MRN: [id number] Date of Birth: 07/27/1970 Referring Provider (PT): Nestor RampNeal, Sara L, MD   Encounter Date: 04/27/2021   PT End of Session - 04/27/21 1258     Visit Number 3    Number of Visits 60    Date for PT Re-Evaluation 06/07/21    Authorization Type UHC    Authorization Time Period 60 visit limit annually    Authorization - Visit Number 3    Authorization - Number of Visits 60    PT Start Time 1257    PT Stop Time 1345    PT Time Calculation (min) 48 min    Activity Tolerance Patient tolerated treatment well    Behavior [facility] for tasks assessed/performed             No past medical history on file.  No past surgical history on file.  There were no vitals filed for this visit.   Subjective Assessment - 04/27/21 1431     Subjective I felt good after the first pool session. I continue to hurt pretty much LT knee > RT and both shoulders    Pertinent History PSH: ACL repair Lt knee 2015    Diagnostic tests cspine xray 2022: DDD C5-C7, reversal of lordosis, 2021 Lt knee medial compartment degeneration (Hx of ACL repair), Rt knee negative, 2021 bil shoulder US: partial supraspinatus tears    Currently in Pain? Yes   Lt knee, Bil shoulders 7-8/10 a sharp ache   Aggravating Factors  failry constant    Pain Relieving Factors Typically being in the water seems to feel better    Multiple Pain Sites No             Treatment: Patient seen for aquatic therapy today.  Treatment took place in water 3.5-4.5 feet deep depending upon activity.  Pt entered the pool via stairs with mild use of rails. Water temp 92 degrees F. Pt requires buoyancy of water for support and to offload joints with strengthening exercises.    Seated water bench with 75%  submersion Pt performed seated LE AROM exercises 20x in all planes, 2# added for LAQ 10x Bil Water walking 6x each all directions blue noodle for postural support. High knee marching 4x short length of pool. Wall ex to include:10x Bil hip circumduction, flex/ext/abd: Vc to control her motion. Core compressions with back supported on wall 5 sec hold 10x, shoulder horizontal add/abd 10x withmulti-colored wts, decompression float with only neck pillow to end session 5 min.                              PT Short Term Goals - 03/29/21 1402       PT SHORT TERM GOAL #1   Title Pt will demo consistent work rate throughout aquatic session with minimal need for breaks.    Time 6    Period Weeks    Status New    Target Date 05/10/21      PT SHORT TERM GOAL #2   Title Pt will participate in 6' walk test in pool to establish baseline for distance and endurance.    Time 4    Period Weeks    Status New    Target Date 04/26/21  PT Long Term Goals - 03/29/21 1335       PT LONG TERM GOAL #1   Title Pt will improve bil UE and LE strength to at least 4/5 for improved task performance at home and work    Baseline 3+/5    Time 10    Period Weeks    Status New    Target Date 06/07/21      PT LONG TERM GOAL #2   Title Improved 5x sit to stand with UE support as needed in </= 20 sec with improved symmetry of bil LEs and control with descent.    Baseline 28", heavy use of UEs    Time 10    Period Weeks    Status New    Target Date 06/07/21      PT LONG TERM GOAL #3   Title Pt will be able to demo up/down stairs + 1 railing with good control and minimal pain for ease of home access (has 5 stairs with 1 rail)    Time 10    Period Weeks    Status New    Target Date 06/07/21      PT LONG TERM GOAL #4   Title Pt will report pain not to exceed 5/10 with standing and walking tasks up to 2 hours.    Time 10    Period Weeks    Status New    Target Date  06/07/21      PT LONG TERM GOAL #5   Title Pt will be able to participate in a 6' walk test in the pool using symmetrical gait pattern.  Goal may be updated to reflect target number of laps to cover in 6' from baseline.    Time 10    Period Weeks    Status New    Target Date 06/07/21                   Plan - 04/27/21 1434     Clinical Impression Statement Pt arrives for aquatic PT stating her water exercises from last session did not increase any pain but her overall pain remains the same. Pt typicaly has no pain or complaints while exercising in the water. Pt was able to do a greater amount of work today ( mainly from being on time) with no adverse effects.    Personal Factors and Comorbidities Fitness;Time since onset of injury/illness/exacerbation;Profession    Examination-Activity Limitations Locomotion Level;Transfers;Reach Overhead;Bend;Stand;Dressing;Lift;Squat    Stability/Clinical Decision Making Evolving/Moderate complexity    Rehab Potential Good    PT Frequency 2x / week    PT Treatment/Interventions ADLs/Self Care Home Management;Aquatic Therapy;Energy conservation;Therapeutic exercise;Neuromuscular re-education;Gait training;Stair training;Therapeutic activities;Functional mobility training;Patient/family education    PT Next Visit Plan establish baseline of 6' walk test in pool (update LTG), LE/UE strength, cervical ROM    Consulted and Agree with Plan of Care Patient             Patient will benefit from skilled therapeutic intervention in order to improve the following deficits and impairments:  Abnormal gait, Pain, Impaired UE functional use, Decreased endurance, Postural dysfunction, Decreased strength, Decreased mobility, Decreased activity tolerance, Decreased range of motion  Visit Diagnosis: Chronic pain of left knee  Chronic pain of right knee  Chronic left shoulder pain  Chronic right shoulder pain  Cervicalgia  Muscle weakness  (generalized)  Other abnormalities of gait and mobility     Problem List Patient Active Problem List   Diagnosis Date Noted  Chronic pain of both shoulders 03/23/2021   Neck pain 03/23/2021   Primary osteoarthritis of both knees 10/15/2019   S/P ACL repair 10/15/2019   Acute right-sided low back pain 10/15/2019   Right rotator cuff tendinitis 10/15/2019    Denzil Mceachron, PTA 04/27/2021, 2:44 PM  [facility] @ [facility] [address] [place], [place], [address] Phone: [phone]   Fax:  [phone]  Name: Morgan Perkins MRN: [id number] Date of Birth: [birth year]

## 2021-05-02 ENCOUNTER — Other Ambulatory Visit: Payer: Self-pay

## 2021-05-02 ENCOUNTER — Encounter: Payer: Self-pay | Admitting: Physical Therapy

## 2021-05-02 ENCOUNTER — Ambulatory Visit: Payer: 59 | Admitting: Physical Therapy

## 2021-05-02 DIAGNOSIS — M25562 Pain in left knee: Secondary | ICD-10-CM | POA: Diagnosis not present

## 2021-05-02 DIAGNOSIS — G8929 Other chronic pain: Secondary | ICD-10-CM

## 2021-05-02 DIAGNOSIS — M25561 Pain in right knee: Secondary | ICD-10-CM

## 2021-05-02 DIAGNOSIS — M25512 Pain in left shoulder: Secondary | ICD-10-CM

## 2021-05-02 DIAGNOSIS — R2689 Other abnormalities of gait and mobility: Secondary | ICD-10-CM

## 2021-05-02 DIAGNOSIS — M6281 Muscle weakness (generalized): Secondary | ICD-10-CM

## 2021-05-02 NOTE — Therapy (Signed)
[place] @ [employer] [place], [place], [address] Phone: [phone]   Fax:  [phone]  Physical Therapy Treatment  Patient Details  Name: Morgan Perkins MRN: [id number] Date of Birth: 1970-08-26 Referring Provider (PT): Dickie La, MD   Encounter Date: 05/02/2021   PT End of Session - 05/02/21 0939     Visit Number 4    Number of Visits 60    Date for PT Re-Evaluation 06/07/21    Authorization Type UHC    Authorization Time Period 60 visit limit annually    Authorization - Visit Number 4    Authorization - Number of Visits 60    PT Start Time [phone]   A little late   PT Stop Time 1015    PT Time Calculation (min) 38 min    Activity Tolerance Patient tolerated treatment well    Behavior During Therapy [facility] for tasks assessed/performed             History reviewed. No pertinent past medical history.  History reviewed. No pertinent surgical history.  There were no vitals filed for this visit.   Treatment: Patient seen for aquatic therapy today.  Treatment took place in water 3.5-4.5 feet deep depending upon activity.  Pt entered the pool via stairs with mild use of the rails. Water temp 92 degrees F. Pt requires buoyancy of water for support and to offload joints with strengthening exercises.    Standing 75% depth for water walking 10x forward & back with runners arms, added hand paddles for side stepping 10x with VC to mini squat as best as she could to get more of her shoulders submerged. HAnd paddles for shoulder flex/ext 2x10, multicolored UE water weights for horizontal add/abd 2x10 with 90% submersion for both exercises. Neck pillow core compressions 5 sec hold 10x, hip abd 10x Bil, high knee marching 6x with blue noodle. Seated decompression hang x 5 min with yellow noodle.                               PT Short Term Goals - 03/29/21 1402       PT SHORT TERM GOAL #1   Title Pt  will demo consistent work rate throughout aquatic session with minimal need for breaks.    Time 6    Period Weeks    Status New    Target Date 05/10/21      PT SHORT TERM GOAL #2   Title Pt will participate in 6' walk test in pool to establish baseline for distance and endurance.    Time 4    Period Weeks    Status New    Target Date 04/26/21               PT Long Term Goals - 03/29/21 1335       PT LONG TERM GOAL #1   Title Pt will improve bil UE and LE strength to at least 4/5 for improved task performance at home and work    Baseline 3+/5    Time 10    Period Weeks    Status New    Target Date 06/07/21      PT LONG TERM GOAL #2   Title Improved 5x sit to stand with UE support as needed in </= 20 sec with improved symmetry of bil LEs and control with descent.    Baseline 28", heavy use  of UEs    Time 10    Period Weeks    Status New    Target Date 06/07/21      PT LONG TERM GOAL #3   Title Pt will be able to demo up/down stairs + 1 railing with good control and minimal pain for ease of home access (has 5 stairs with 1 rail)    Time 10    Period Weeks    Status New    Target Date 06/07/21      PT LONG TERM GOAL #4   Title Pt will report pain not to exceed 5/10 with standing and walking tasks up to 2 hours.    Time 10    Period Weeks    Status New    Target Date 06/07/21      PT LONG TERM GOAL #5   Title Pt will be able to participate in a 6' walk test in the pool using symmetrical gait pattern.  Goal may be updated to reflect target number of laps to cover in 6' from baseline.    Time 10    Period Weeks    Status New    Target Date 06/07/21                   Plan - 05/02/21 0940     Clinical Impression Statement Pt arrives a few min late for aquatic PT but was able to accomplish all her exercises in the available time. Pt reports her knees have felt much better since doing the water therapy, not as much intense pain and not as often. Pt  reports she would like to focus a little more on her shoulders as they have been giving her a fit lately and it keeps her from sleeping. No shoulder pain with exercises as we attempted to submerge her shoulders more ( difficult since pt is tall.) Otherwise, no adverse effects, pt tolerated entire treatment very well.    Personal Factors and Comorbidities Fitness;Time since onset of injury/illness/exacerbation;Profession    Examination-Activity Limitations Locomotion Level;Transfers;Reach Overhead;Bend;Stand;Dressing;Lift;Squat    Examination-Participation Restrictions Community Activity;Occupation;Laundry;Shop;Cleaning;Meal Prep;Driving    Stability/Clinical Decision Making Evolving/Moderate complexity    Rehab Potential Good    PT Frequency 2x / week    PT Duration Other (comment)    PT Treatment/Interventions ADLs/Self Care Home Management;Aquatic Therapy;Energy conservation;Therapeutic exercise;Neuromuscular re-education;Gait training;Stair training;Therapeutic activities;Functional mobility training;Patient/family education    PT Next Visit Plan Strength, see how shoulders did having more focus today.    Consulted and Agree with Plan of Care Patient             Patient will benefit from skilled therapeutic intervention in order to improve the following deficits and impairments:  Abnormal gait, Pain, Impaired UE functional use, Decreased endurance, Postural dysfunction, Decreased strength, Decreased mobility, Decreased activity tolerance, Decreased range of motion  Visit Diagnosis: Chronic pain of left knee  Chronic pain of right knee  Chronic left shoulder pain  Chronic right shoulder pain  Muscle weakness (generalized)  Other abnormalities of gait and mobility     Problem List Patient Active Problem List   Diagnosis Date Noted   Chronic pain of both shoulders 03/23/2021   Neck pain 03/23/2021   Primary osteoarthritis of both knees 10/15/2019   S/P ACL repair 10/15/2019    Acute right-sided low back pain 10/15/2019   Right rotator cuff tendinitis 10/15/2019    Morgan Perkins, PTA 05/02/2021, 9:16 PM  [facility] @  [place], [place], [address] Phone: [phone]   Fax:  [phone]  Name: Morgan Perkins MRN: [id number] Date of Birth: [birth year]

## 2021-05-04 ENCOUNTER — Encounter: Payer: Self-pay | Admitting: Physical Therapy

## 2021-05-04 ENCOUNTER — Ambulatory Visit: Payer: 59 | Admitting: Physical Therapy

## 2021-05-04 ENCOUNTER — Other Ambulatory Visit: Payer: Self-pay

## 2021-05-04 DIAGNOSIS — R2689 Other abnormalities of gait and mobility: Secondary | ICD-10-CM

## 2021-05-04 DIAGNOSIS — M25562 Pain in left knee: Secondary | ICD-10-CM

## 2021-05-04 DIAGNOSIS — M25561 Pain in right knee: Secondary | ICD-10-CM

## 2021-05-04 DIAGNOSIS — M25512 Pain in left shoulder: Secondary | ICD-10-CM

## 2021-05-04 DIAGNOSIS — G8929 Other chronic pain: Secondary | ICD-10-CM

## 2021-05-04 DIAGNOSIS — M6281 Muscle weakness (generalized): Secondary | ICD-10-CM

## 2021-05-04 DIAGNOSIS — M542 Cervicalgia: Secondary | ICD-10-CM

## 2021-05-04 NOTE — Therapy (Signed)
[facility] @ [facility] [address] [place], [place], [address] Phone: [phone]   Fax:  [phone]  Physical Therapy Treatment  Patient Details  Name: Morgan Perkins MRN: [id number] Date of Birth: [birth year] Referring Provider (PT): Nestor Ramp, MD   Encounter Date: 05/04/2021   PT End of Session - 05/04/21 1424     Visit Number 5    Number of Visits 60    Date for PT Re-Evaluation 06/07/21    Authorization Type UHC    Authorization Time Period 60 visit limit annually    Authorization - Visit Number 5    Authorization - Number of Visits 60    PT Start Time 1315   15 min late   PT Stop Time 1350    PT Time Calculation (min) 35 min    Activity Tolerance Patient tolerated treatment well    Behavior During Therapy [facility] for tasks assessed/performed             History reviewed. No pertinent past medical history.  History reviewed. No pertinent surgical history.  There were no vitals filed for this visit.   Subjective Assessment - 05/04/21 1426     Subjective Puttin my shoulders under the water more was very helpful.    Pertinent History PSH: ACL repair Lt knee 2015    Diagnostic tests cspine xray 2022: DDD C5-C7, reversal of lordosis, 2021 Lt knee medial compartment degeneration (Hx of ACL repair), Rt knee negative, 2021 bil shoulder [place]: partial supraspinatus tears    Currently in Pain? No/denies             Treatment:Patient seen for aquatic therapy today.  Treatment took place in water 3.5-4.5 feet deep depending upon activity.  Pt entered the pool via stairs with mild use of the rails. Water temp 90 degrees F. Pt requires buoyancy of water for support and to offload joints with strengthening exercises.    75% depth water walking 10x in each direction,hand paddles for side stepping 10x, high knee marching 6x with blue noodle for postural activation, hip 3 ways Bil 15x, VC to make ROM smaller, more control. Hand  paddles for shld flex/ext 10x standing against the wall. Pt had trouble keeping tailbone on the wall, hug the tree with hand paddles, both with 90% submersion to get shoulders under water. Core compressions 10x 5 sec hold. Horizontal decompression float, pt requires no flotation devices. Step downs 10x with UE on rails for support.                             PT Short Term Goals - 03/29/21 1402       PT SHORT TERM GOAL #1   Title Pt will demo consistent work rate throughout aquatic session with minimal need for breaks.    Time 6    Period Weeks    Status New    Target Date 05/10/21      PT SHORT TERM GOAL #2   Title Pt will participate in 6' walk test in pool to establish baseline for distance and endurance.    Time 4    Period Weeks    Status New    Target Date 04/26/21               PT Long Term Goals - 03/29/21 1335       PT LONG TERM GOAL #1   Title Pt will improve bil  UE and LE strength to at least 4/5 for improved task performance at home and work    Baseline 3+/5    Time 10    Period Weeks    Status New    Target Date 06/07/21      PT LONG TERM GOAL #2   Title Improved 5x sit to stand with UE support as needed in </= 20 sec with improved symmetry of bil LEs and control with descent.    Baseline 28", heavy use of UEs    Time 10    Period Weeks    Status New    Target Date 06/07/21      PT LONG TERM GOAL #3   Title Pt will be able to demo up/down stairs + 1 railing with good control and minimal pain for ease of home access (has 5 stairs with 1 rail)    Time 10    Period Weeks    Status New    Target Date 06/07/21      PT LONG TERM GOAL #4   Title Pt will report pain not to exceed 5/10 with standing and walking tasks up to 2 hours.    Time 10    Period Weeks    Status New    Target Date 06/07/21      PT LONG TERM GOAL #5   Title Pt will be able to participate in a 6' walk test in the pool using symmetrical gait pattern.  Goal  may be updated to reflect target number of laps to cover in 6' from baseline.    Time 10    Period Weeks    Status New    Target Date 06/07/21                   Plan - 05/04/21 1427     Clinical Impression Statement Pt arrives 15 min for aquatic PT today. Pt reports submerging her shoulders more last visit helped, she did not ache during the night as much when trying to sleep.    Personal Factors and Comorbidities Fitness;Time since onset of injury/illness/exacerbation;Profession    Examination-Activity Limitations Locomotion Level;Transfers;Reach Overhead;Bend;Stand;Dressing;Lift;Squat    Stability/Clinical Decision Making Evolving/Moderate complexity    Rehab Potential Good    PT Frequency 2x / week    PT Treatment/Interventions ADLs/Self Care Home Management;Aquatic Therapy;Energy conservation;Therapeutic exercise;Neuromuscular re-education;Gait training;Stair training;Therapeutic activities;Functional mobility training;Patient/family education    PT Next Visit Plan Strength and mobility aquatics    Consulted and Agree with Plan of Care Patient             Patient will benefit from skilled therapeutic intervention in order to improve the following deficits and impairments:  Abnormal gait, Pain, Impaired UE functional use, Decreased endurance, Postural dysfunction, Decreased strength, Decreased mobility, Decreased activity tolerance, Decreased range of motion  Visit Diagnosis: Chronic pain of left knee  Chronic pain of right knee  Chronic left shoulder pain  Chronic right shoulder pain  Muscle weakness (generalized)  Other abnormalities of gait and mobility  Cervicalgia     Problem List Patient Active Problem List   Diagnosis Date Noted   Chronic pain of both shoulders 03/23/2021   Neck pain 03/23/2021   Primary osteoarthritis of both knees 10/15/2019   S/P ACL repair 10/15/2019   Acute right-sided low back pain 10/15/2019   Right rotator cuff  tendinitis 10/15/2019    Morgan Perkins, PTA 05/04/2021, 3:19 PM  [facility] [phone]  [address] [place], [place], [address] Phone: [phone]   Fax:  [phone]  Name: Morgan Perkins MRN: [id number] Date of Birth: Jul 20, 1970

## 2021-05-09 ENCOUNTER — Encounter: Payer: Self-pay | Admitting: Physical Therapy

## 2021-05-09 ENCOUNTER — Other Ambulatory Visit: Payer: Self-pay

## 2021-05-09 ENCOUNTER — Ambulatory Visit: Payer: 59 | Admitting: Physical Therapy

## 2021-05-09 DIAGNOSIS — M6281 Muscle weakness (generalized): Secondary | ICD-10-CM

## 2021-05-09 DIAGNOSIS — G8929 Other chronic pain: Secondary | ICD-10-CM

## 2021-05-09 DIAGNOSIS — R2689 Other abnormalities of gait and mobility: Secondary | ICD-10-CM

## 2021-05-09 DIAGNOSIS — M25562 Pain in left knee: Secondary | ICD-10-CM | POA: Diagnosis not present

## 2021-05-09 DIAGNOSIS — M542 Cervicalgia: Secondary | ICD-10-CM

## 2021-05-09 DIAGNOSIS — M25512 Pain in left shoulder: Secondary | ICD-10-CM

## 2021-05-09 NOTE — Therapy (Signed)
[facility] @ [place], [place], [address] Phone: [phone]   Fax:  [phone]  Physical Therapy Treatment  Patient Details  Name: Morgan Perkins MRN: [id number] Date of Birth: 03-23-1971 Referring Provider (PT): Dickie La, MD   Encounter Date: 05/09/2021   PT End of Session - 05/09/21 0922     Visit Number 6    Number of Visits 60    Date for PT Re-Evaluation 06/07/21    Authorization Type UHC    Authorization Time Period 60 visit limit annually    Authorization - Visit Number 6    Authorization - Number of Visits 60    PT Start Time 0920    PT Stop Time 1010    PT Time Calculation (min) 50 min    Activity Tolerance Patient tolerated treatment well    Behavior During Therapy [facility] for tasks assessed/performed             History reviewed. No pertinent past medical history.  History reviewed. No pertinent surgical history.  There were no vitals filed for this visit.   Subjective Assessment - 05/09/21 0924     Subjective Body is achey this AM. I'm on time today!    Pertinent History PSH: ACL repair Lt knee 2015    Currently in Pain? Yes   Overall body aches this AM.            Treatment; Patient seen for aquatic therapy today.  Treatment took place in water 3.5-4.5 feet deep depending upon activity.  Pt entered the pool via stairs with mild use of rails, step to step. Water temp 86 degrees F. Pt requires buoyancy of water for support and to offload joints with strengthening exercises.    50% depth for water walking: 10x each, pt able to increase her speed ( mostly due to cooler temp) hand paddles used for side stepping/min squats 10x, high knee marching with blue noodle 10x, wall exercises inc: hip 3 ways 15x Bil VC to engage core, core compressions with blue noodle 5 sec hold 10x, hand paddles shld flex/ext 10x then horizontal add/abd 10x, underwater bicycle 5 min in horseback position on th  eyellow noodle 2 bouts. First step lateral steps up and downs 3x Rt 4x LT: soft tissue mobilization via the jets in the hot tub x 5 min to her low back.                              PT Short Term Goals - 05/09/21 1054       PT SHORT TERM GOAL #1   Title Pt will demo consistent work rate throughout aquatic session with minimal need for breaks.    Time 6    Period Weeks    Target Date 05/10/21      PT SHORT TERM GOAL #2   Title Pt will participate in 6' walk test in pool to establish baseline for distance and endurance.    Time 4    Period Weeks    Status Achieved   Pt easily walking 10 min with no breaks              PT Long Term Goals - 03/29/21 1335       PT LONG TERM GOAL #1   Title Pt will improve bil UE and LE strength to at least 4/5 for improved task performance at home and work  Baseline 3+/5    Time 10    Period Weeks    Status New    Target Date 06/07/21      PT LONG TERM GOAL #2   Title Improved 5x sit to stand with UE support as needed in </= 20 sec with improved symmetry of bil LEs and control with descent.    Baseline 28", heavy use of UEs    Time 10    Period Weeks    Status New    Target Date 06/07/21      PT LONG TERM GOAL #3   Title Pt will be able to demo up/down stairs + 1 railing with good control and minimal pain for ease of home access (has 5 stairs with 1 rail)    Time 10    Period Weeks    Status New    Target Date 06/07/21      PT LONG TERM GOAL #4   Title Pt will report pain not to exceed 5/10 with standing and walking tasks up to 2 hours.    Time 10    Period Weeks    Status New    Target Date 06/07/21      PT LONG TERM GOAL #5   Title Pt will be able to participate in a 6' walk test in the pool using symmetrical gait pattern.  Goal may be updated to reflect target number of laps to cover in 6' from baseline.    Time 10    Period Weeks    Status New    Target Date 06/07/21                    Plan - 05/09/21 0926     Clinical Impression Statement Pt was able to complete 2 rounds of underwater bicycle in horseback position on the yellow noodle. Pt reports her body is responding to the water exercise in a positive way and she very much enjoys it. Pt able to utilize the hand paddles for UE resistance today. No reports of increased pain throughout todays session. All short terms goals are met as of today.    Personal Factors and Comorbidities Fitness;Time since onset of injury/illness/exacerbation;Profession    Examination-Activity Limitations Locomotion Level;Transfers;Reach Overhead;Bend;Stand;Dressing;Lift;Squat    Examination-Participation Restrictions Community Activity;Occupation;Laundry;Shop;Cleaning;Meal Prep;Driving    Stability/Clinical Decision Making Evolving/Moderate complexity    Rehab Potential Good    PT Frequency 2x / week    PT Duration Other (comment)    PT Treatment/Interventions ADLs/Self Care Home Management;Aquatic Therapy;Energy conservation;Therapeutic exercise;Neuromuscular re-education;Gait training;Stair training;Therapeutic activities;Functional mobility training;Patient/family education    PT Next Visit Plan Strength and mobility aquatics    Consulted and Agree with Plan of Care Patient             Patient will benefit from skilled therapeutic intervention in order to improve the following deficits and impairments:  Abnormal gait, Pain, Impaired UE functional use, Decreased endurance, Postural dysfunction, Decreased strength, Decreased mobility, Decreased activity tolerance, Decreased range of motion  Visit Diagnosis: Chronic pain of left knee  Chronic left shoulder pain  Chronic pain of right knee  Chronic right shoulder pain  Muscle weakness (generalized)  Cervicalgia  Other abnormalities of gait and mobility     Problem List Patient Active Problem List   Diagnosis Date Noted   Chronic pain of both shoulders  03/23/2021   Neck pain 03/23/2021   Primary osteoarthritis of both knees 10/15/2019   S/P ACL repair 10/15/2019   Acute right-sided  low back pain 10/15/2019   Right rotator cuff tendinitis 10/15/2019    Samamtha Tiegs, PTA 05/09/2021, 10:56 AM  [facility] @ [place], [place], [address] Phone: [phone]   Fax:  [phone]  Name: Nohelia Valenza MRN: [id number] Date of Birth: July 17, 1970

## 2021-05-11 ENCOUNTER — Other Ambulatory Visit: Payer: Self-pay

## 2021-05-11 ENCOUNTER — Encounter: Payer: Self-pay | Admitting: Physical Therapy

## 2021-05-11 ENCOUNTER — Ambulatory Visit: Payer: 59 | Admitting: Physical Therapy

## 2021-05-11 DIAGNOSIS — M6281 Muscle weakness (generalized): Secondary | ICD-10-CM

## 2021-05-11 DIAGNOSIS — M25511 Pain in right shoulder: Secondary | ICD-10-CM

## 2021-05-11 DIAGNOSIS — M25562 Pain in left knee: Secondary | ICD-10-CM | POA: Diagnosis not present

## 2021-05-11 DIAGNOSIS — M542 Cervicalgia: Secondary | ICD-10-CM

## 2021-05-11 DIAGNOSIS — R2689 Other abnormalities of gait and mobility: Secondary | ICD-10-CM

## 2021-05-11 DIAGNOSIS — G8929 Other chronic pain: Secondary | ICD-10-CM

## 2021-05-11 NOTE — Therapy (Signed)
[facility] @ [facility] [address] [place], [place], [address] Phone: [phone]   Fax:  [phone]  Physical Therapy Treatment  Patient Details  Name: Morgan Perkins MRN: [id number] Date of Birth: 1970-05-28 Referring Provider (PT): Nestor Ramp, MD   Encounter Date: 05/11/2021   PT End of Session - 05/11/21 1204     Visit Number 7    Number of Visits 60    Date for PT Re-Evaluation 06/07/21    Authorization Type UHC    Authorization Time Period 60 visit limit annually    Authorization - Visit Number 7    Authorization - Number of Visits 60    PT Start Time 1203    PT Stop Time 1250    PT Time Calculation (min) 47 min    Activity Tolerance Patient tolerated treatment well    Behavior During Therapy [facility] for tasks assessed/performed             History reviewed. No pertinent past medical history.  History reviewed. No pertinent surgical history.  There were no vitals filed for this visit.   Subjective Assessment - 05/11/21 1248     Subjective This pool workouts are really helping me feel better.    Currently in Pain? No/denies               Treatment: Patient seen for aquatic therapy today.  Treatment took place in water 2.5-4 feet deep depending upon activity.  Pt entered the pool via stairs, mild use of rails. Water temp 87 degrees F. Pt requires buoyancy of water for support and to offload joints with strengthening exercises.    50%-75% depth in standing water walking with increased pace 10x each, mittne hand for side stepping ( hand paddles not available). Wall exs: hip 3 ways 20x each bil, LAQ 3# 20x Bil, core compressions with kick board 5 sec hold 10x, underwater bicycle on yellow noodle in horseback 10 min/ 1 min rest then repeat another 10 min. UE wts, single buoy horizontal abd/add 20x, then shld flex/ext 10x (difficult) Horizontal decompression float with no flotation x 5  min.                          PT Short Term Goals - 05/09/21 1054       PT SHORT TERM GOAL #1   Title Pt will demo consistent work rate throughout aquatic session with minimal need for breaks.    Time 6    Period Weeks    Target Date 05/10/21      PT SHORT TERM GOAL #2   Title Pt will participate in 6' walk test in pool to establish baseline for distance and endurance.    Time 4    Period Weeks    Status Achieved   Pt easily walking 10 min with no breaks              PT Long Term Goals - 03/29/21 1335       PT LONG TERM GOAL #1   Title Pt will improve bil UE and LE strength to at least 4/5 for improved task performance at home and work    Baseline 3+/5    Time 10    Period Weeks    Status New    Target Date 06/07/21      PT LONG TERM GOAL #2   Title Improved 5x sit to stand with UE support as  needed in </= 20 sec with improved symmetry of bil LEs and control with descent.    Baseline 28", heavy use of UEs    Time 10    Period Weeks    Status New    Target Date 06/07/21      PT LONG TERM GOAL #3   Title Pt will be able to demo up/down stairs + 1 railing with good control and minimal pain for ease of home access (has 5 stairs with 1 rail)    Time 10    Period Weeks    Status New    Target Date 06/07/21      PT LONG TERM GOAL #4   Title Pt will report pain not to exceed 5/10 with standing and walking tasks up to 2 hours.    Time 10    Period Weeks    Status New    Target Date 06/07/21      PT LONG TERM GOAL #5   Title Pt will be able to participate in a 6' walk test in the pool using symmetrical gait pattern.  Goal may be updated to reflect target number of laps to cover in 6' from baseline.    Time 10    Period Weeks    Status New    Target Date 06/07/21                   Plan - 05/11/21 1250     Clinical Impression Statement Pt reporting her overall body pains are decreasing and she more often than not feels better,  less pain, less body aches. pt was able to perform 2 10 min bouts of underwater bicycling in horseback on th eyello wnoodle with little fatigue. Added resistance to LAQ, no issues in the water. Pt has already joined the facility as a member and plans on continuing her aquatic exercise after PT has finished.    Personal Factors and Comorbidities Fitness;Time since onset of injury/illness/exacerbation;Profession    Examination-Activity Limitations Locomotion Level;Transfers;Reach Overhead;Bend;Stand;Dressing;Lift;Squat    Examination-Participation Restrictions Community Activity;Occupation;Laundry;Shop;Cleaning;Meal Prep;Driving    Stability/Clinical Decision Making Evolving/Moderate complexity    Rehab Potential Good    PT Frequency 2x / week    PT Duration Other (comment)    PT Treatment/Interventions ADLs/Self Care Home Management;Aquatic Therapy;Energy conservation;Therapeutic exercise;Neuromuscular re-education;Gait training;Stair training;Therapeutic activities;Functional mobility training;Patient/family education    PT Next Visit Plan Strength and mobility aquatics    Consulted and Agree with Plan of Care Patient             Patient will benefit from skilled therapeutic intervention in order to improve the following deficits and impairments:  Abnormal gait, Pain, Impaired UE functional use, Decreased endurance, Postural dysfunction, Decreased strength, Decreased mobility, Decreased activity tolerance, Decreased range of motion  Visit Diagnosis: Chronic pain of left knee  Chronic left shoulder pain  Chronic pain of right knee  Chronic right shoulder pain  Muscle weakness (generalized)  Cervicalgia  Other abnormalities of gait and mobility     Problem List Patient Active Problem List   Diagnosis Date Noted   Chronic pain of both shoulders 03/23/2021   Neck pain 03/23/2021   Primary osteoarthritis of both knees 10/15/2019   S/P ACL repair 10/15/2019   Acute right-sided  low back pain 10/15/2019   Right rotator cuff tendinitis 10/15/2019    Breena Bevacqua, PTA 05/11/2021, 12:53 PM   [facility] [address] [place], [place], [id number] Phone: [phone]  Fax:  [phone]  Name: Jeriah Skufca MRN: [id number] Date of Birth: [birth year]

## 2021-05-16 ENCOUNTER — Other Ambulatory Visit: Payer: Self-pay

## 2021-05-16 ENCOUNTER — Ambulatory Visit: Payer: 59 | Attending: Family Medicine | Admitting: Physical Therapy

## 2021-05-16 ENCOUNTER — Encounter: Payer: Self-pay | Admitting: Physical Therapy

## 2021-05-16 DIAGNOSIS — M25561 Pain in right knee: Secondary | ICD-10-CM | POA: Diagnosis present

## 2021-05-16 DIAGNOSIS — M25562 Pain in left knee: Secondary | ICD-10-CM | POA: Insufficient documentation

## 2021-05-16 DIAGNOSIS — R2689 Other abnormalities of gait and mobility: Secondary | ICD-10-CM | POA: Insufficient documentation

## 2021-05-16 DIAGNOSIS — M25511 Pain in right shoulder: Secondary | ICD-10-CM | POA: Diagnosis present

## 2021-05-16 DIAGNOSIS — M6281 Muscle weakness (generalized): Secondary | ICD-10-CM | POA: Diagnosis present

## 2021-05-16 DIAGNOSIS — M25512 Pain in left shoulder: Secondary | ICD-10-CM | POA: Insufficient documentation

## 2021-05-16 DIAGNOSIS — G8929 Other chronic pain: Secondary | ICD-10-CM | POA: Diagnosis present

## 2021-05-16 DIAGNOSIS — M542 Cervicalgia: Secondary | ICD-10-CM | POA: Insufficient documentation

## 2021-05-16 NOTE — Therapy (Signed)
[facility] @ [facility] [place], [place], [address] Phone: [phone]   Fax:  [phone]  Physical Therapy Treatment  Patient Details  Name: Morgan Perkins MRN: [id number] Date of Birth: 1970-05-19 Referring Provider (PT): Dickie La, MD   Encounter Date: 05/16/2021   PT End of Session - 05/16/21 0849     Visit Number 8    Number of Visits 60    Date for PT Re-Evaluation 06/07/21    Authorization Type UHC    Authorization Time Period 60 visit limit annually    Authorization - Visit Number 8    Authorization - Number of Visits 60    PT Start Time 0848    PT Stop Time 0935    PT Time Calculation (min) 47 min    Activity Tolerance Patient tolerated treatment well    Behavior During Therapy [facility] for tasks assessed/performed             History reviewed. No pertinent past medical history.  History reviewed. No pertinent surgical history.  There were no vitals filed for this visit.   Subjective Assessment - 05/16/21 1102     Subjective I'm aching today, unsure why. Some days I feel much better then some days I hurt again.    Pertinent History PSH: ACL repair Lt knee 2015    Diagnostic tests cspine xray 2022: DDD C5-C7, reversal of lordosis, 2021 Lt knee medial compartment degeneration (Hx of ACL repair), Rt knee negative, 2021 bil shoulder [place]: partial supraspinatus tears    Currently in Pain? --   General joint achiness          Treatment; Patient seen for aquatic therapy today.  Treatment took place in water 3.5-4.5 feet deep depending upon activity.  Pt entered the pool via stairs with mild use of hand rails. Water temp 86 degrees F. Pt requires buoyancy of water for support and to offload joints with strengthening exercises.  Pt utilizes viscosity of the water required for strengthening.   50% submersion: water walking/jogging 10x each direction, pt able to do either ogger arms or breast stroke arms. Side  stepping with hand paddles 10x. High knee march with blue noodle 10 lengths, VC to tighten up core. Wall exs for Bil hip 3 ways 20x. Yellow UE weights for shoulder high rows, horizontal add/abd 20x each. Horseback bicycle on yellow noodle 8 min, 30 sec rest, repeat 8 min. 5 min lumbar soft tissue mobilization utilizing the jets to end session.                               PT Short Term Goals - 05/16/21 1111       PT SHORT TERM GOAL #1   Title Pt will demo consistent work rate throughout aquatic session with minimal need for breaks.    Time 6    Period Weeks    Status Achieved    Target Date 05/10/21               PT Long Term Goals - 03/29/21 1335       PT LONG TERM GOAL #1   Title Pt will improve bil UE and LE strength to at least 4/5 for improved task performance at home and work    Baseline 3+/5    Time 10    Period Weeks    Status New    Target Date 06/07/21  PT LONG TERM GOAL #2   Title Improved 5x sit to stand with UE support as needed in </= 20 sec with improved symmetry of bil LEs and control with descent.    Baseline 28", heavy use of UEs    Time 10    Period Weeks    Status New    Target Date 06/07/21      PT LONG TERM GOAL #3   Title Pt will be able to demo up/down stairs + 1 railing with good control and minimal pain for ease of home access (has 5 stairs with 1 rail)    Time 10    Period Weeks    Status New    Target Date 06/07/21      PT LONG TERM GOAL #4   Title Pt will report pain not to exceed 5/10 with standing and walking tasks up to 2 hours.    Time 10    Period Weeks    Status New    Target Date 06/07/21      PT LONG TERM GOAL #5   Title Pt will be able to participate in a 6' walk test in the pool using symmetrical gait pattern.  Goal may be updated to reflect target number of laps to cover in 6' from baseline.    Time 10    Period Weeks    Status New    Target Date 06/07/21                    Plan - 05/16/21 1103     Clinical Impression Statement Pt arrives to aquatic PT with more general joint achiness than previous. She thinks weather could be affecting her joints but isn't sure. Pt continues to have major success exercising in the water: no pain, good full body movements. Pt increased her underwater bicycle to 8 min 2x with minimal fatigue. All short term goals are now met.    Personal Factors and Comorbidities Fitness;Time since onset of injury/illness/exacerbation;Profession    Examination-Activity Limitations Locomotion Level;Transfers;Reach Overhead;Bend;Stand;Dressing;Lift;Squat    Examination-Participation Restrictions Community Activity;Occupation;Laundry;Shop;Cleaning;Meal Prep;Driving    Stability/Clinical Decision Making Evolving/Moderate complexity    Rehab Potential Good    PT Frequency 2x / week    PT Duration Other (comment)    PT Treatment/Interventions ADLs/Self Care Home Management;Aquatic Therapy;Energy conservation;Therapeutic exercise;Neuromuscular re-education;Gait training;Stair training;Therapeutic activities;Functional mobility training;Patient/family education    PT Next Visit Plan Strength and mobility aquatics    Consulted and Agree with Plan of Care Patient             Patient will benefit from skilled therapeutic intervention in order to improve the following deficits and impairments:  Abnormal gait, Pain, Impaired UE functional use, Decreased endurance, Postural dysfunction, Decreased strength, Decreased mobility, Decreased activity tolerance, Decreased range of motion  Visit Diagnosis: Chronic pain of left knee  Chronic left shoulder pain  Chronic pain of right knee  Chronic right shoulder pain  Muscle weakness (generalized)  Cervicalgia  Other abnormalities of gait and mobility     Problem List Patient Active Problem List   Diagnosis Date Noted   Chronic pain of both shoulders 03/23/2021   Neck pain 03/23/2021   Primary  osteoarthritis of both knees 10/15/2019   S/P ACL repair 10/15/2019   Acute right-sided low back pain 10/15/2019   Right rotator cuff tendinitis 10/15/2019    [name], PTA 05/16/2021, 11:12 AM  [facility] @ [facility] [address] [place], [place],  [address] Phone: [phone]   Fax:  [phone]  Name: Morgan Perkins MRN: [id number] Date of Birth: 13-Jan-1971

## 2021-05-18 ENCOUNTER — Encounter: Payer: Self-pay | Admitting: Physical Therapy

## 2021-05-18 ENCOUNTER — Other Ambulatory Visit: Payer: Self-pay

## 2021-05-18 ENCOUNTER — Ambulatory Visit: Payer: 59 | Admitting: Physical Therapy

## 2021-05-18 DIAGNOSIS — R2689 Other abnormalities of gait and mobility: Secondary | ICD-10-CM

## 2021-05-18 DIAGNOSIS — M542 Cervicalgia: Secondary | ICD-10-CM

## 2021-05-18 DIAGNOSIS — M25562 Pain in left knee: Secondary | ICD-10-CM | POA: Diagnosis not present

## 2021-05-18 DIAGNOSIS — M6281 Muscle weakness (generalized): Secondary | ICD-10-CM

## 2021-05-18 DIAGNOSIS — M25512 Pain in left shoulder: Secondary | ICD-10-CM

## 2021-05-18 DIAGNOSIS — G8929 Other chronic pain: Secondary | ICD-10-CM

## 2021-05-18 DIAGNOSIS — M25511 Pain in right shoulder: Secondary | ICD-10-CM

## 2021-05-18 NOTE — Therapy (Signed)
[facility] @ [place], [place], [address] Phone: [phone]   Fax:  [phone]  Physical Therapy Treatment  Patient Details  Name: Morgan Perkins MRN: [id number] Date of Birth: 03/29/1971 Referring Provider (PT): Dickie La, MD   Encounter Date: 05/18/2021   PT End of Session - 05/18/21 1353     Visit Number 9    Number of Visits 60    Date for PT Re-Evaluation 06/07/21    Authorization Type UHC    Authorization Time Period 60 visit limit annually    Authorization - Visit Number 9    Authorization - Number of Visits 60    PT Start Time 1351    PT Stop Time 1433    PT Time Calculation (min) 42 min    Activity Tolerance Patient tolerated treatment well    Behavior During Therapy [facility] for tasks assessed/performed             History reviewed. No pertinent past medical history.  History reviewed. No pertinent surgical history.  There were no vitals filed for this visit.     Treatment: Patient seen for aquatic therapy today.  Treatment took place in water 2.5-4 feet deep depending upon activity.  Pt entered the pool via stairs with mild use of rails. Water temp 88 degrees F. Pt requires buoyancy of water for support and to offload joints with strengthening exercises.  Pt utilizes viscosity of the water required for strengthening.   50% depth: Water walking with jogging arms 10x each direction. Hand paddles for side to side with mini squat. Deep squat bothered LT knee today. 10x, High knee marching with blue noodle 8 lengths. Wall exs including hip 3 ways Bil 20x each, hand paddles for shoulder flex/ext 10x, horizontal abd/add 10x, Quad stretches Bil, Horseback bicycle 10 min continous with yellow noodle. Soft tissue mobilization to upper back and shoulders utilizing the jets x 5 min post.                             PT Short Term Goals - 05/16/21 1111       PT SHORT TERM GOAL #1    Title Pt will demo consistent work rate throughout aquatic session with minimal need for breaks.    Time 6    Period Weeks    Status Achieved    Target Date 05/10/21               PT Long Term Goals - 03/29/21 1335       PT LONG TERM GOAL #1   Title Pt will improve bil UE and LE strength to at least 4/5 for improved task performance at home and work    Baseline 3+/5    Time 10    Period Weeks    Status New    Target Date 06/07/21      PT LONG TERM GOAL #2   Title Improved 5x sit to stand with UE support as needed in </= 20 sec with improved symmetry of bil LEs and control with descent.    Baseline 28", heavy use of UEs    Time 10    Period Weeks    Status New    Target Date 06/07/21      PT LONG TERM GOAL #3   Title Pt will be able to demo up/down stairs + 1 railing with good control and minimal pain  for ease of home access (has 5 stairs with 1 rail)    Time 10    Period Weeks    Status New    Target Date 06/07/21      PT LONG TERM GOAL #4   Title Pt will report pain not to exceed 5/10 with standing and walking tasks up to 2 hours.    Time 10    Period Weeks    Status New    Target Date 06/07/21      PT LONG TERM GOAL #5   Title Pt will be able to participate in a 6' walk test in the pool using symmetrical gait pattern.  Goal may be updated to reflect target number of laps to cover in 6' from baseline.    Time 10    Period Weeks    Status New    Target Date 06/07/21                   Plan - 05/18/21 1554     Clinical Impression Statement Pt arrives to aquatic PT with some LT shoulder pain and LT knee pain. Neither of these pains affected her ability to exercise in the water. Pt utilizing max resistance on her shoulders without complaint. Smaller squats were better for her Lt today.    Personal Factors and Comorbidities Fitness;Time since onset of injury/illness/exacerbation;Profession    Examination-Activity Limitations Locomotion  Level;Transfers;Reach Overhead;Bend;Stand;Dressing;Lift;Squat    Stability/Clinical Decision Making Evolving/Moderate complexity    PT Treatment/Interventions ADLs/Self Care Home Management;Aquatic Therapy;Energy conservation;Therapeutic exercise;Neuromuscular re-education;Gait training;Stair training;Therapeutic activities;Functional mobility training;Patient/family education             Patient will benefit from skilled therapeutic intervention in order to improve the following deficits and impairments:  Abnormal gait, Pain, Impaired UE functional use, Decreased endurance, Postural dysfunction, Decreased strength, Decreased mobility, Decreased activity tolerance, Decreased range of motion  Visit Diagnosis: Chronic pain of left knee  Chronic left shoulder pain  Chronic pain of right knee  Muscle weakness (generalized)  Chronic right shoulder pain  Cervicalgia  Other abnormalities of gait and mobility     Problem List Patient Active Problem List   Diagnosis Date Noted   Chronic pain of both shoulders 03/23/2021   Neck pain 03/23/2021   Primary osteoarthritis of both knees 10/15/2019   S/P ACL repair 10/15/2019   Acute right-sided low back pain 10/15/2019   Right rotator cuff tendinitis 10/15/2019    Manaal Mandala, PTA 05/18/2021, 3:57 PM  [facility] @ [facility] [address], [place], [address] Phone: [phone]   Fax:  [phone]  Name: Morgan Perkins MRN: [id number] Date of Birth: [birth year]

## 2021-05-23 ENCOUNTER — Ambulatory Visit: Payer: 59 | Admitting: Physical Therapy

## 2021-05-23 ENCOUNTER — Encounter: Payer: Self-pay | Admitting: Physical Therapy

## 2021-05-23 ENCOUNTER — Other Ambulatory Visit: Payer: Self-pay

## 2021-05-23 DIAGNOSIS — G8929 Other chronic pain: Secondary | ICD-10-CM

## 2021-05-23 DIAGNOSIS — R2689 Other abnormalities of gait and mobility: Secondary | ICD-10-CM

## 2021-05-23 DIAGNOSIS — M25561 Pain in right knee: Secondary | ICD-10-CM

## 2021-05-23 DIAGNOSIS — M25511 Pain in right shoulder: Secondary | ICD-10-CM

## 2021-05-23 DIAGNOSIS — M542 Cervicalgia: Secondary | ICD-10-CM

## 2021-05-23 DIAGNOSIS — M25562 Pain in left knee: Secondary | ICD-10-CM | POA: Diagnosis not present

## 2021-05-23 DIAGNOSIS — M6281 Muscle weakness (generalized): Secondary | ICD-10-CM

## 2021-05-23 NOTE — Therapy (Signed)
[facility] @ [facility] [address] [place], [place], [address] Phone: [phone]   Fax:  [phone]  Physical Therapy Treatment  Patient Details  Name: Morgan Perkins MRN: [id number] Date of Birth: 1970-06-18 Referring Provider (PT): Nestor Ramp, MD   Encounter Date: 05/23/2021   PT End of Session - 05/23/21 0811     Visit Number 10    Number of Visits 60    Date for PT Re-Evaluation 06/07/21    Authorization Type UHC    Authorization - Visit Number 10    Authorization - Number of Visits 60    PT Start Time 0808    PT Stop Time 0847    PT Time Calculation (min) 39 min    Activity Tolerance Patient tolerated treatment well    Behavior During Therapy [facility] for tasks assessed/performed             History reviewed. No pertinent past medical history.  History reviewed. No pertinent surgical history.  There were no vitals filed for this visit.   Subjective Assessment - 05/23/21 1145     Subjective Shoulders doing better, my knees are achey/sore from doing a lot of stair climbing over the weekend.    Pertinent History PSH: ACL repair Lt knee 2015    Currently in Pain? Yes   Knees are achey: 3-4/10   Pain Relieving Factors Water    Multiple Pain Sites No            Pt arrives for aquatic physical therapy. Treatment took place in 3.5-5.5 feet of water. Water temperature was 86 degrees F.  Pt entered the pool via stairs with mild use of rails. Pt requires buoyancy of water for support and to offload joints with strengthening exercises.  Pt utilizes viscosity of the water required for strengthening.   50% depth: water walking with jogging arms or breast stroke UE for forward/back. Hand paddles for side stepping ( no squatting as knees were still achey.) 10x each. Hand paddles for shoulder flex/ext 2 min, horizontal abd/add 2 min, wall exercises for hip 3 ways 20x each, neck pillow for core concurrent with high knee marching 10x,  horseback underwater bicycle x 10 min yellow noodle.                              PT Short Term Goals - 05/16/21 1111       PT SHORT TERM GOAL #1   Title Pt will demo consistent work rate throughout aquatic session with minimal need for breaks.    Time 6    Period Weeks    Status Achieved    Target Date 05/10/21               PT Long Term Goals - 03/29/21 1335       PT LONG TERM GOAL #1   Title Pt will improve bil UE and LE strength to at least 4/5 for improved task performance at home and work    Baseline 3+/5    Time 10    Period Weeks    Status New    Target Date 06/07/21      PT LONG TERM GOAL #2   Title Improved 5x sit to stand with UE support as needed in </= 20 sec with improved symmetry of bil LEs and control with descent.    Baseline 28", heavy use of UEs    Time  10    Period Weeks    Status New    Target Date 06/07/21      PT LONG TERM GOAL #3   Title Pt will be able to demo up/down stairs + 1 railing with good control and minimal pain for ease of home access (has 5 stairs with 1 rail)    Time 10    Period Weeks    Status New    Target Date 06/07/21      PT LONG TERM GOAL #4   Title Pt will report pain not to exceed 5/10 with standing and walking tasks up to 2 hours.    Time 10    Period Weeks    Status New    Target Date 06/07/21      PT LONG TERM GOAL #5   Title Pt will be able to participate in a 6' walk test in the pool using symmetrical gait pattern.  Goal may be updated to reflect target number of laps to cover in 6' from baseline.    Time 10    Period Weeks    Status New    Target Date 06/07/21                   Plan - 05/23/21 1146     Clinical Impression Statement Pt arrives for aquatic PT with better reports regarding shoulders, knees are achey/sore from probably a combination of weather/climbing stairs a lot of the weekend. Pt walking & bicycling in the water abolished most knee symptoms. PTA  advised pt to not participate in any jumping water exercises.    Personal Factors and Comorbidities Fitness;Time since onset of injury/illness/exacerbation;Profession    Examination-Activity Limitations Locomotion Level;Transfers;Reach Overhead;Bend;Stand;Dressing;Lift;Squat    Examination-Participation Restrictions Community Activity;Occupation;Laundry;Shop;Cleaning;Meal Prep;Driving    Stability/Clinical Decision Making Evolving/Moderate complexity    Rehab Potential Good    PT Frequency 2x / week    PT Duration Other (comment)    PT Treatment/Interventions ADLs/Self Care Home Management;Aquatic Therapy;Energy conservation;Therapeutic exercise;Neuromuscular re-education;Gait training;Stair training;Therapeutic activities;Functional mobility training;Patient/family education    PT Next Visit Plan Strength and mobility aquatics             Patient will benefit from skilled therapeutic intervention in order to improve the following deficits and impairments:  Abnormal gait, Pain, Impaired UE functional use, Decreased endurance, Postural dysfunction, Decreased strength, Decreased mobility, Decreased activity tolerance, Decreased range of motion  Visit Diagnosis: Chronic pain of left knee  Chronic left shoulder pain  Chronic pain of right knee  Muscle weakness (generalized)  Chronic right shoulder pain  Cervicalgia  Other abnormalities of gait and mobility     Problem List Patient Active Problem List   Diagnosis Date Noted   Chronic pain of both shoulders 03/23/2021   Neck pain 03/23/2021   Primary osteoarthritis of both knees 10/15/2019   S/P ACL repair 10/15/2019   Acute right-sided low back pain 10/15/2019   Right rotator cuff tendinitis 10/15/2019    Viraj Liby, PTA 05/23/2021, 11:51 AM  [facility] @ [facility] [address] [place], [place], [address] Phone: [phone]   Fax:  [phone]  Name: Morgan Perkins MRN: [id number] Date of Birth: 1971/01/02

## 2021-05-25 ENCOUNTER — Other Ambulatory Visit: Payer: Self-pay

## 2021-05-25 ENCOUNTER — Ambulatory Visit: Payer: 59 | Admitting: Physical Therapy

## 2021-05-25 ENCOUNTER — Encounter: Payer: Self-pay | Admitting: Physical Therapy

## 2021-05-25 DIAGNOSIS — M25562 Pain in left knee: Secondary | ICD-10-CM

## 2021-05-25 DIAGNOSIS — G8929 Other chronic pain: Secondary | ICD-10-CM

## 2021-05-25 DIAGNOSIS — R2689 Other abnormalities of gait and mobility: Secondary | ICD-10-CM

## 2021-05-25 DIAGNOSIS — M542 Cervicalgia: Secondary | ICD-10-CM

## 2021-05-25 DIAGNOSIS — M6281 Muscle weakness (generalized): Secondary | ICD-10-CM

## 2021-05-25 NOTE — Therapy (Signed)
[facility] @ [facility] [address] [place], [place], [address] Phone: [phone]   Fax:  [phone]  Physical Therapy Treatment  Patient Details  Name: Morgan Perkins MRN: [id number] Date of Birth: August 05, 1970 Referring Provider (PT): Nestor Ramp, MD   Encounter Date: 05/25/2021   PT End of Session - 05/25/21 1442     Visit Number 11    Number of Visits 60    Date for PT Re-Evaluation 06/07/21    Authorization Type UHC    Authorization Time Period 60 visit limit annually    Authorization - Visit Number 11    Authorization - Number of Visits 60    PT Start Time 1350    PT Stop Time 1430    PT Time Calculation (min) 40 min    Activity Tolerance Patient tolerated treatment well    Behavior During Therapy [facility] for tasks assessed/performed             History reviewed. No pertinent past medical history.  History reviewed. No pertinent surgical history.  There were no vitals filed for this visit.   Subjective Assessment - 05/25/21 1601     Subjective Overall good day, Lt knee is better than previous session. I really want to have less pain going down stairs.    Currently in Pain? --   No current pain.   Multiple Pain Sites No            Treatment: Pt arrives for aquatic physical therapy. Treatment took place in 3.5-5.5 feet of water. Water temperature was 90 degrees F. Pt entered the pool via stairs with mild use of hand rails. Pt requires buoyancy of water for support and to offload joints with strengthening exercises.  Pt utilizes viscosity of the water required for strengthening.   Standing in 50%- 75% depth water pt performed water walking in all 4 directions 10x. VC for speed in order to generate appropriate current for resistance and/or UE movements. Hand paddles added for side stepping.  Wall exercises to inc: Hip 3 ways 20x ea with min support of pool wall. VC to push/pull against water with a force appropriate. Bil  heel raises 20x no UE support. High knee marching with neck pillow 8x. Core compressions with neck pillow 5 sec hold 10x. Hand paddles for shoulder ext/flex 2x10, horizontal add/abd 2x 10, VC to maintain core throughout exercises. Underwater bicycle in horseback using large noodle for 10 min. Forward step ups on underwaterstep 10x, step downs 10x, some UE support required. 5 min lumbar soft tissue mobilization utilizing jets to end session.                               PT Short Term Goals - 05/16/21 1111       PT SHORT TERM GOAL #1   Title Pt will demo consistent work rate throughout aquatic session with minimal need for breaks.    Time 6    Period Weeks    Status Achieved    Target Date 05/10/21               PT Long Term Goals - 03/29/21 1335       PT LONG TERM GOAL #1   Title Pt will improve bil UE and LE strength to at least 4/5 for improved task performance at home and work    Baseline 3+/5    Time 10  Period Weeks    Status New    Target Date 06/07/21      PT LONG TERM GOAL #2   Title Improved 5x sit to stand with UE support as needed in </= 20 sec with improved symmetry of bil LEs and control with descent.    Baseline 28", heavy use of UEs    Time 10    Period Weeks    Status New    Target Date 06/07/21      PT LONG TERM GOAL #3   Title Pt will be able to demo up/down stairs + 1 railing with good control and minimal pain for ease of home access (has 5 stairs with 1 rail)    Time 10    Period Weeks    Status New    Target Date 06/07/21      PT LONG TERM GOAL #4   Title Pt will report pain not to exceed 5/10 with standing and walking tasks up to 2 hours.    Time 10    Period Weeks    Status New    Target Date 06/07/21      PT LONG TERM GOAL #5   Title Pt will be able to participate in a 6' walk test in the pool using symmetrical gait pattern.  Goal may be updated to reflect target number of laps to cover in 6' from baseline.     Time 10    Period Weeks    Status New    Target Date 06/07/21                   Plan - 05/25/21 1603     Clinical Impression Statement Pt arrives for aquatic PT today without pain. Pain continues to be of an intermittent nature but absolutely improved since evaluation. Lt knee has felt better since last session. Pt has most knee pain descending stairs. Pt was able to use the underwater step to perform step ups and step downs which was less painful to do versus using the steps to get in and out of the pool.    Personal Factors and Comorbidities Fitness;Time since onset of injury/illness/exacerbation;Profession    Examination-Activity Limitations Locomotion Level;Transfers;Reach Overhead;Bend;Stand;Dressing;Lift;Squat    Examination-Participation Restrictions Community Activity;Occupation;Laundry;Shop;Cleaning;Meal Prep;Driving    Stability/Clinical Decision Making Evolving/Moderate complexity    Rehab Potential Good    PT Frequency 2x / week    PT Duration Other (comment)    PT Treatment/Interventions ADLs/Self Care Home Management;Aquatic Therapy;Energy conservation;Therapeutic exercise;Neuromuscular re-education;Gait training;Stair training;Therapeutic activities;Functional mobility training;Patient/family education    PT Next Visit Plan Strength and mobility aquatics    Consulted and Agree with Plan of Care Patient             Patient will benefit from skilled therapeutic intervention in order to improve the following deficits and impairments:  Abnormal gait, Pain, Impaired UE functional use, Decreased endurance, Postural dysfunction, Decreased strength, Decreased mobility, Decreased activity tolerance, Decreased range of motion  Visit Diagnosis: Chronic pain of left knee  Chronic left shoulder pain  Chronic pain of right knee  Muscle weakness (generalized)  Chronic right shoulder pain  Cervicalgia  Other abnormalities of gait and mobility     Problem  List Patient Active Problem List   Diagnosis Date Noted   Chronic pain of both shoulders 03/23/2021   Neck pain 03/23/2021   Primary osteoarthritis of both knees 10/15/2019   S/P ACL repair 10/15/2019   Acute right-sided low back pain 10/15/2019  Right rotator cuff tendinitis 10/15/2019    Lenita Peregrina, PTA 05/25/2021, 4:06 PM  [facility] @ [facility] [address] [place], [place], [address] Phone: [phone]   Fax:  [phone]  Name: Morgan Perkins MRN: [id number] Date of Birth: 1971-04-13

## 2021-05-30 ENCOUNTER — Encounter: Payer: Self-pay | Admitting: Physical Therapy

## 2021-05-30 ENCOUNTER — Other Ambulatory Visit: Payer: Self-pay

## 2021-05-30 ENCOUNTER — Ambulatory Visit: Payer: 59 | Admitting: Physical Therapy

## 2021-05-30 DIAGNOSIS — M25512 Pain in left shoulder: Secondary | ICD-10-CM

## 2021-05-30 DIAGNOSIS — R2689 Other abnormalities of gait and mobility: Secondary | ICD-10-CM

## 2021-05-30 DIAGNOSIS — M6281 Muscle weakness (generalized): Secondary | ICD-10-CM

## 2021-05-30 DIAGNOSIS — M542 Cervicalgia: Secondary | ICD-10-CM

## 2021-05-30 DIAGNOSIS — M25562 Pain in left knee: Secondary | ICD-10-CM | POA: Diagnosis not present

## 2021-05-30 DIAGNOSIS — M25561 Pain in right knee: Secondary | ICD-10-CM

## 2021-05-30 DIAGNOSIS — G8929 Other chronic pain: Secondary | ICD-10-CM

## 2021-05-30 NOTE — Therapy (Signed)
[facility] @ [facility] [address] [place], [place], [address] Phone: [phone]   Fax:  [phone]  Physical Therapy Treatment  Patient Details  Name: Morgan Perkins MRN: [id number] Date of Birth: [birth year] Referring Provider (PT): Nestor Ramp, MD   Encounter Date: 05/30/2021   PT End of Session - 05/30/21 0807     Visit Number 12    Number of Visits 60    Date for PT Re-Evaluation 06/07/21    Authorization Type U[employer]    Authorization Time Period 60 visit limit annually    Authorization - Visit Number 12    Authorization - Number of Visits 60    PT Start Time 0804    PT Stop Time 0900    PT Time Calculation (min) 56 min    Activity Tolerance Patient tolerated treatment well    Behavior During Therapy [facility] for tasks assessed/performed             History reviewed. No pertinent past medical history.  History reviewed. No pertinent surgical history.  There were no vitals filed for this visit.   Subjective Assessment - 05/30/21 0851     Subjective I like doing that step exercise in the water. I think it's going to help my knees when I go down stairs.    Diagnostic tests cspine xray 2022: DDD C5-C7, reversal of lordosis, 2021 Lt knee medial compartment degeneration (Hx of ACL repair), Rt knee negative, 2021 bil shoulder [place]: partial supraspinatus tears    Currently in Pain? Yes   Knees are a little achey 3-5/10, just got off work so not too bad   Aggravating Factors  Just random, intermittent    Pain Relieving Factors water             Treatment; Pt arrives for aquatic physical therapy. Treatment took place in 3.5-5.5 feet of water. Water temperature was 92 degrees F. Pt entered the pool via stairs with mild use of rails.Pt requires buoyancy of water for support and to offload joints with strengthening exercises. Pt utilizes viscosity of the water required for strengthening.   Standing in 50%- 75% depth water pt  performed water walking in all 4 directions 10x. VC for speed in order to generate appropriate current for resistance and/or UE movements. Hand paddles added for side stepping and yellow noodle used added for forward and backward walking to generate more current.  Wall exercises to inc: Hip 3 ways 20x ea with min support of pool wall. VC to push/pull against water with a force appropriate. Core contractions in partial wall squat with blue noodle 5 sec hold 10x. High knee marching with neck pillow 8 lengths. Hand paddles for shoulder ext/flex 2x10, horizontal add/abd 2x 10, VC to maintain core throughout exercises. Underwater bicycle in horseback using large noodle for 10 min. Hamstring stretching on second step using rails for balance, adding ankle PF/DF at end of hold.  Step ups Bil 2x10 step downs 2x10 Bil on underwater step. Single buoy push downs for scapular co-contractions 10 sec hold 4x.                              PT Short Term Goals - 05/16/21 1111       PT SHORT TERM GOAL #1   Title Pt will demo consistent work rate throughout aquatic session with minimal need for breaks.    Time 6    Period  Weeks    Status Achieved    Target Date 05/10/21               PT Long Term Goals - 03/29/21 1335       PT LONG TERM GOAL #1   Title Pt will improve bil UE and LE strength to at least 4/5 for improved task performance at home and work    Baseline 3+/5    Time 10    Period Weeks    Status New    Target Date 06/07/21      PT LONG TERM GOAL #2   Title Improved 5x sit to stand with UE support as needed in </= 20 sec with improved symmetry of bil LEs and control with descent.    Baseline 28", heavy use of UEs    Time 10    Period Weeks    Status New    Target Date 06/07/21      PT LONG TERM GOAL #3   Title Pt will be able to demo up/down stairs + 1 railing with good control and minimal pain for ease of home access (has 5 stairs with 1 rail)    Time 10     Period Weeks    Status New    Target Date 06/07/21      PT LONG TERM GOAL #4   Title Pt will report pain not to exceed 5/10 with standing and walking tasks up to 2 hours.    Time 10    Period Weeks    Status New    Target Date 06/07/21      PT LONG TERM GOAL #5   Title Pt will be able to participate in a 6' walk test in the pool using symmetrical gait pattern.  Goal may be updated to reflect target number of laps to cover in 6' from baseline.    Time 10    Period Weeks    Status New    Target Date 06/07/21                   Plan - 05/30/21 0853     Clinical Impression Statement Pt reports the addition of the stepping exercises went very well with her knee strenght. She felt she can do that with less knee pain and will be able to make some better progress doing down her stairs at home. Pt was able to add resistance to her forward and backward walking creating a large current for her LE. No pain with todays exercises.    Personal Factors and Comorbidities Fitness;Time since onset of injury/illness/exacerbation;Profession    Examination-Activity Limitations Locomotion Level;Transfers;Reach Overhead;Bend;Stand;Dressing;Lift;Squat    Examination-Participation Restrictions Community Activity;Occupation;Laundry;Shop;Cleaning;Meal Prep;Driving    Stability/Clinical Decision Making Evolving/Moderate complexity    Rehab Potential Good    PT Frequency 2x / week    PT Treatment/Interventions ADLs/Self Care Home Management;Aquatic Therapy;Energy conservation;Therapeutic exercise;Neuromuscular re-education;Gait training;Stair training;Therapeutic activities;Functional mobility training;Patient/family education    PT Next Visit Plan Strength and mobility aquatics             Patient will benefit from skilled therapeutic intervention in order to improve the following deficits and impairments:  Abnormal gait, Pain, Impaired UE functional use, Decreased endurance, Postural dysfunction,  Decreased strength, Decreased mobility, Decreased activity tolerance, Decreased range of motion  Visit Diagnosis: Chronic pain of left knee  Chronic left shoulder pain  Chronic pain of right knee  Chronic right shoulder pain  Muscle weakness (generalized)  Cervicalgia  Other  abnormalities of gait and mobility     Problem List Patient Active Problem List   Diagnosis Date Noted   Chronic pain of both shoulders 03/23/2021   Neck pain 03/23/2021   Primary osteoarthritis of both knees 10/15/2019   S/P ACL repair 10/15/2019   Acute right-sided low back pain 10/15/2019   Right rotator cuff tendinitis 10/15/2019    Morgan Perkins, Morgan Perkins 05/30/2021, 8:55 AM  [facility] @ [facility] [address] [place], [place], [address] Phone: [phone]   Fax:  [phone]  Name: Morgan Perkins MRN: [id number] Date of Birth: 01-26-1971

## 2021-06-01 ENCOUNTER — Ambulatory Visit: Payer: 59 | Admitting: Physical Therapy

## 2021-06-06 ENCOUNTER — Other Ambulatory Visit: Payer: Self-pay

## 2021-06-06 ENCOUNTER — Ambulatory Visit: Payer: 59 | Admitting: Physical Therapy

## 2021-06-06 ENCOUNTER — Encounter: Payer: Self-pay | Admitting: Physical Therapy

## 2021-06-06 DIAGNOSIS — M25511 Pain in right shoulder: Secondary | ICD-10-CM

## 2021-06-06 DIAGNOSIS — M542 Cervicalgia: Secondary | ICD-10-CM

## 2021-06-06 DIAGNOSIS — M6281 Muscle weakness (generalized): Secondary | ICD-10-CM

## 2021-06-06 DIAGNOSIS — R2689 Other abnormalities of gait and mobility: Secondary | ICD-10-CM

## 2021-06-06 DIAGNOSIS — M25562 Pain in left knee: Secondary | ICD-10-CM

## 2021-06-06 DIAGNOSIS — M25512 Pain in left shoulder: Secondary | ICD-10-CM

## 2021-06-06 DIAGNOSIS — G8929 Other chronic pain: Secondary | ICD-10-CM

## 2021-06-06 NOTE — Therapy (Addendum)
[facility] @ [place], [place], [address] Phone: [phone]   Fax:  [phone]  Physical Therapy Treatment  Patient Details  Name: Morgan Perkins MRN: [id number] Date of Birth: 08-15-1970 Referring Provider (PT): Dickie La, MD   Encounter Date: 06/06/2021   PT End of Session - 06/06/21 0811     Visit Number 13    Number of Visits 60    Date for PT Re-Evaluation 06/07/21    Authorization Type UHC    Authorization Time Period 60 visit limit annually    Authorization - Visit Number 13    Authorization - Number of Visits 60    PT Start Time 0810    PT Stop Time 0900    PT Time Calculation (min) 50 min    Activity Tolerance Patient tolerated treatment well    Behavior During Therapy [facility] for tasks assessed/performed             History reviewed. No pertinent past medical history.  History reviewed. No pertinent surgical history.  There were no vitals filed for this visit.       [facility] PT Assessment - 06/06/21 0001       Strength   Overall Strength Comments RT quad 4/5, Lt 4-/5 ( more shakey) Bil hams 4/5: General shoulders Bil 4/5             Treatment: Pt arrives for aquatic physical therapy. Treatment took place in 3.5-5.5 feet of water. Water temperature was.92 degrees F. Pt entered the pool via stairs with mild use of rails. Pt requires buoyancy of water for support and to offload joints with strengthening exercises.  Pt utilizes viscosity of the water required for strengthening.   Standing in 50%- 75% depth water pt performed water walking in all 4 directions 10x. VC for speed in order to generate appropriate current for resistance and/or UE movements. Hand paddles added for side stepping.  Yellow noodle push pull for forward & back. Push the double UE watewr weights underwater and hold while performing knee lifts 20x 2. Underwaterbench step downs with no UE 2x10, Bil, wall exercises for Hip  3 ways 20x Bil. Double UE wieghts horizontal abd/add 2 min, horseback bicycle x 10 min with UE movements.                         PT Short Term Goals - 05/16/21 1111       PT SHORT TERM GOAL #1   Title Pt will demo consistent work rate throughout aquatic session with minimal need for breaks.    Time 6    Period Weeks    Status Achieved    Target Date 05/10/21               PT Long Term Goals - 06/06/21 0100       PT LONG TERM GOAL #3   Title Pt will be able to demo up/down stairs + 1 railing with good control and minimal pain for ease of home access (has 5 stairs with 1 rail)    Time 10    Period Weeks    Status Partially Met   Going up stairs significantly better 75% or more using 1 rail. Going down still painful but more intermittently painful: requires 1-2 rails depending on the day and occasional buckle of LT knee   Target Date 06/07/21      PT LONG TERM GOAL #4  Title Pt will report pain not to exceed 5/10 with standing and walking tasks up to 2 hours.    Time 10    Period Weeks    Status Partially Met   More often than not pain below 5/10 for neck and shoulders, knees ( LT > RT) can still increase to > 5/10   Target Date 06/07/21      PT LONG TERM GOAL #5   Title Pt will be able to participate in a 6' walk test in the pool using symmetrical gait pattern.  Goal may be updated to reflect target number of laps to cover in 6' from baseline.    Time 10    Period Weeks    Status Achieved   Can walk and or bicycle > 10 min without issue: this is > 20 laps   Target Date 06/07/21                   Plan - 06/06/21 0820     Clinical Impression Statement Pt reports cervical and shoulder pain 75% improved, knees 50% but Lt knee still not consistently where pt wants it especially going down stairs. Pt reports intermittent giving way when going down steps. Pt feels the water exercises help her to  exercise and not hurt after and is increasing her  overall strength and activity ( mainly work duties) tolerance.    Personal Factors and Comorbidities Fitness;Time since onset of injury/illness/exacerbation;Profession    Examination-Activity Limitations Locomotion Level;Transfers;Reach Overhead;Bend;Stand;Dressing;Lift;Squat    Examination-Participation Restrictions Community Activity;Occupation;Laundry;Shop;Cleaning;Meal Prep;Driving    Stability/Clinical Decision Making Evolving/Moderate complexity    Rehab Potential Good    PT Frequency 2x / week    PT Duration Other (comment)    PT Treatment/Interventions ADLs/Self Care Home Management;Aquatic Therapy;Energy conservation;Therapeutic exercise;Neuromuscular re-education;Gait training;Stair training;Therapeutic activities;Functional mobility training;Patient/family education    PT Next Visit Plan Send to PT for renewal. pt would like to renew aquatic PT through end of March : 2x weeks for 4 weeks, and then DC to pool at [facility]. She does not prefer land exercise over aquatica.             Patient will benefit from skilled therapeutic intervention in order to improve the following deficits and impairments:  Abnormal gait, Pain, Impaired UE functional use, Decreased endurance, Postural dysfunction, Decreased strength, Decreased mobility, Decreased activity tolerance, Decreased range of motion  Visit Diagnosis: Chronic pain of left knee  Chronic left shoulder pain  Chronic pain of right knee  Muscle weakness (generalized)  Chronic right shoulder pain  Cervicalgia  Other abnormalities of gait and mobility     Problem List Patient Active Problem List   Diagnosis Date Noted   Chronic pain of both shoulders 03/23/2021   Neck pain 03/23/2021   Primary osteoarthritis of both knees 10/15/2019   S/P ACL repair 10/15/2019   Acute right-sided low back pain 10/15/2019   Right rotator cuff tendinitis 10/15/2019    Brianne Maina, PTA 06/06/2021, 9:26 AM  PHYSICAL THERAPY  DISCHARGE SUMMARY  Visits from Start of Care: 13  Current functional level related to goals / functional outcomes: Pt completed 12 aquatic visits but did not return for re-assessment.  Inactive.  D/C PT.   Remaining deficits: See above   Education / Equipment: HEP   Patient agrees to discharge. Patient goals were partially met. Patient is being discharged due to not returning since the last visit.  Morgan Perkins, PT 09/05/21 9:04 AM   [facility] @ [facility] [address] [place], [place], [address] Phone: [phone]   Fax:  [phone]  Name: Morgan Perkins MRN: [id number] Date of Birth: March 07, 1971

## 2021-06-08 ENCOUNTER — Ambulatory Visit: Payer: 59 | Admitting: Physical Therapy

## 2021-06-12 ENCOUNTER — Telehealth: Payer: Self-pay | Admitting: Physical Therapy

## 2021-06-12 ENCOUNTER — Ambulatory Visit: Payer: 59 | Admitting: Physical Therapy

## 2021-06-12 NOTE — Telephone Encounter (Signed)
Pt was a no show for PT reassessment appointment on 06/12/21 at 8:45am.  PT left Pt a voicemail to call our clinic.  Penni Penado, PT 06/12/21 9:05 AM

## 2022-03-07 IMAGING — CR DG CERVICAL SPINE 2 OR 3 VIEWS
3 series · 3 of 3 positions shown · non-contrast
Comparison: None.

CLINICAL DATA: Neck pain.

EXAM:
CERVICAL SPINE - 2-3 VIEW

[w c-spine lat]
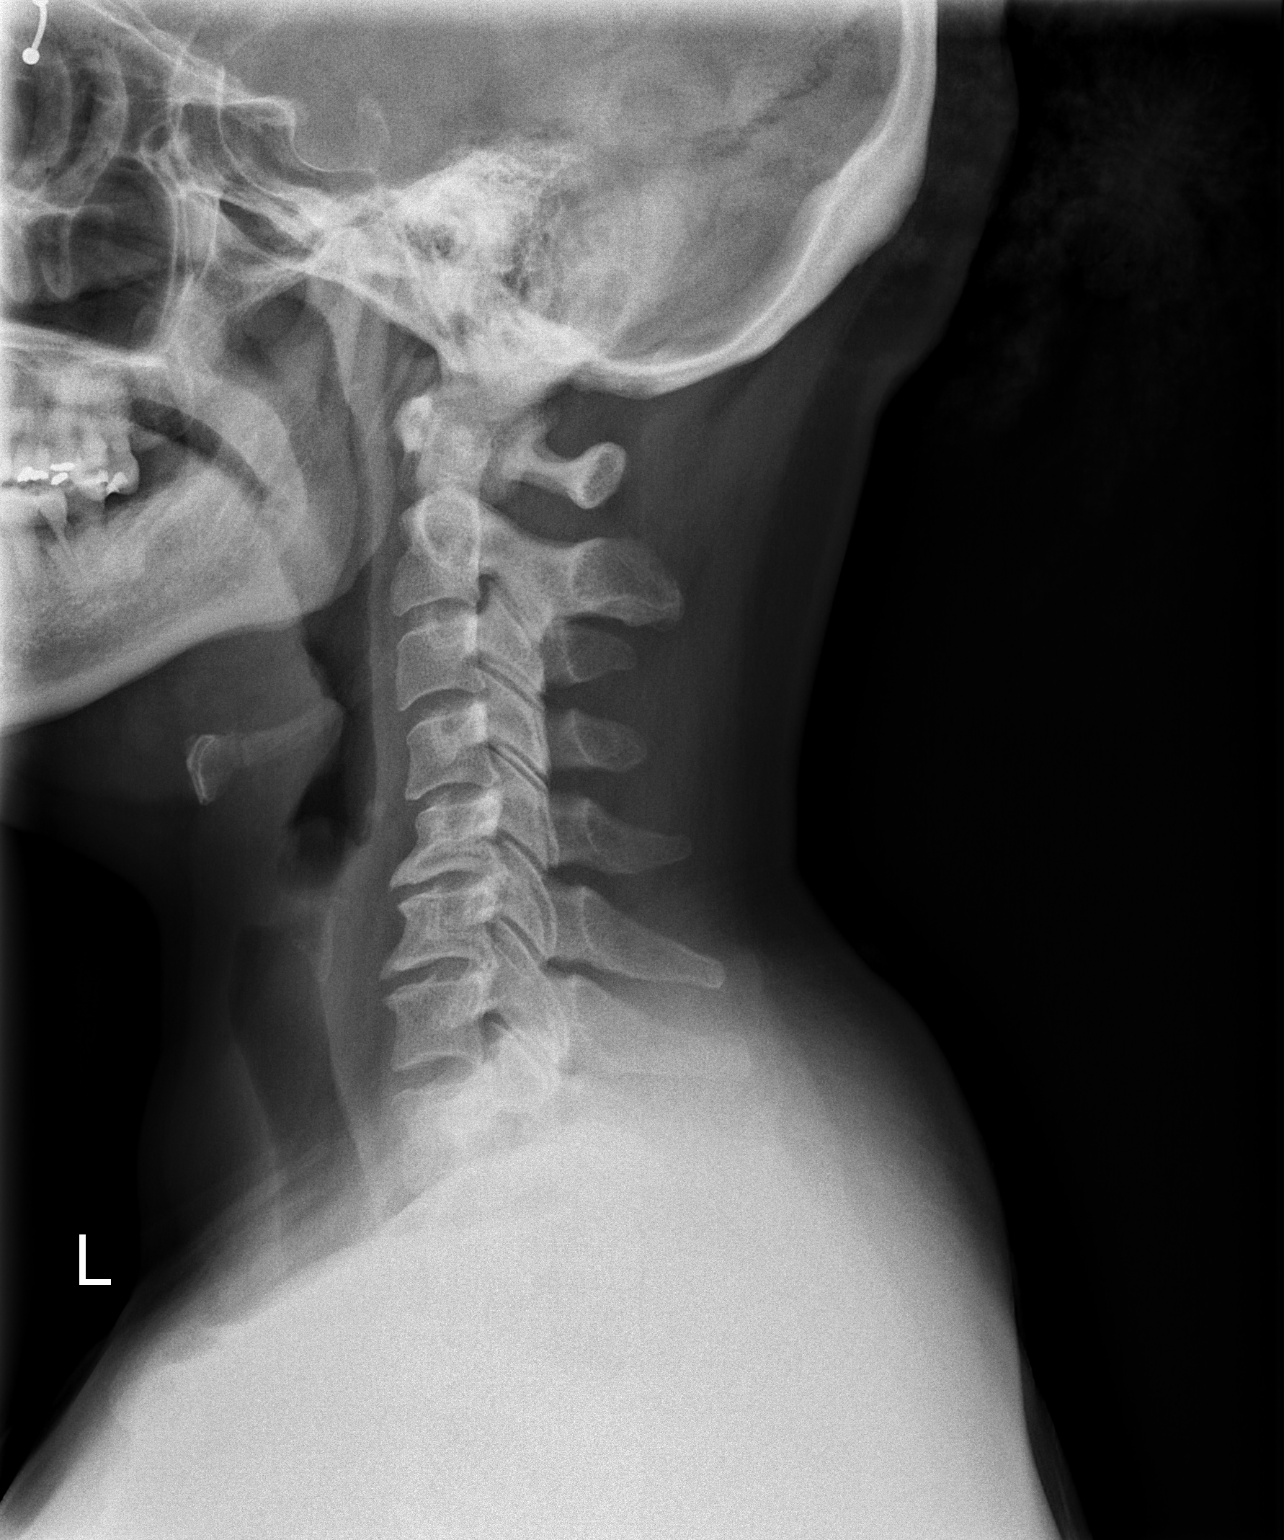

[w c-spine a.p. *]
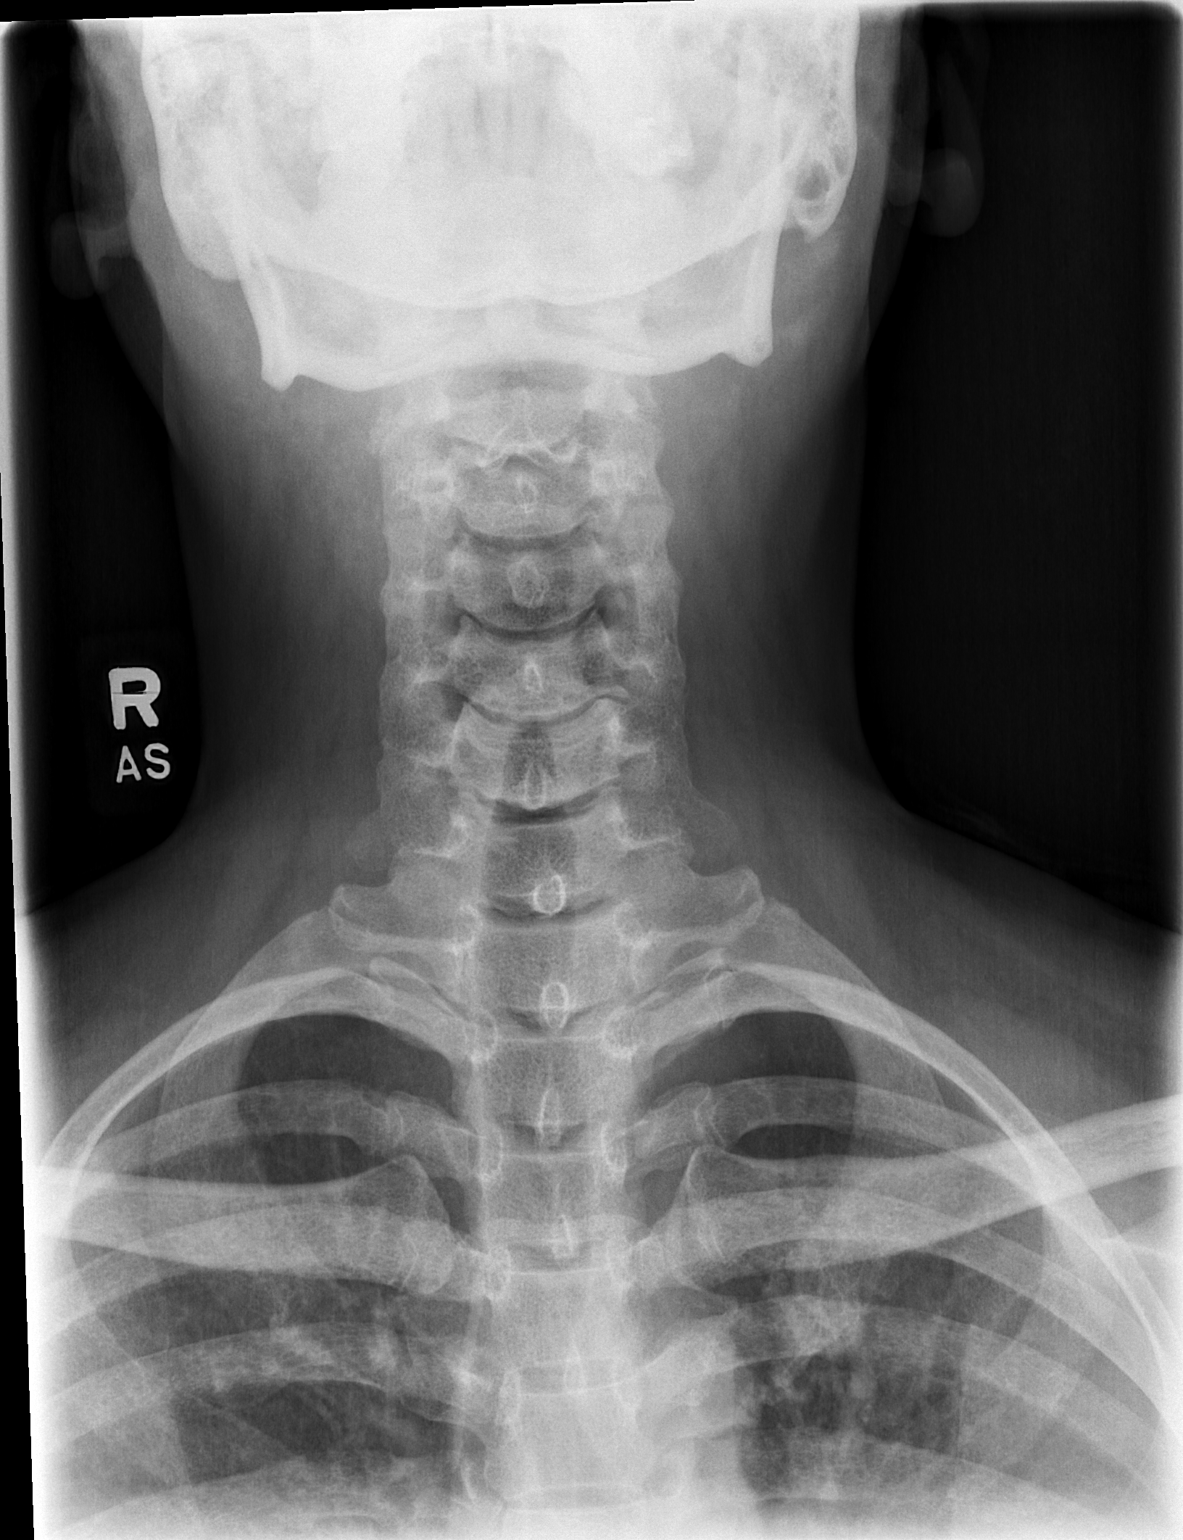

[w c-spine odontoid *]
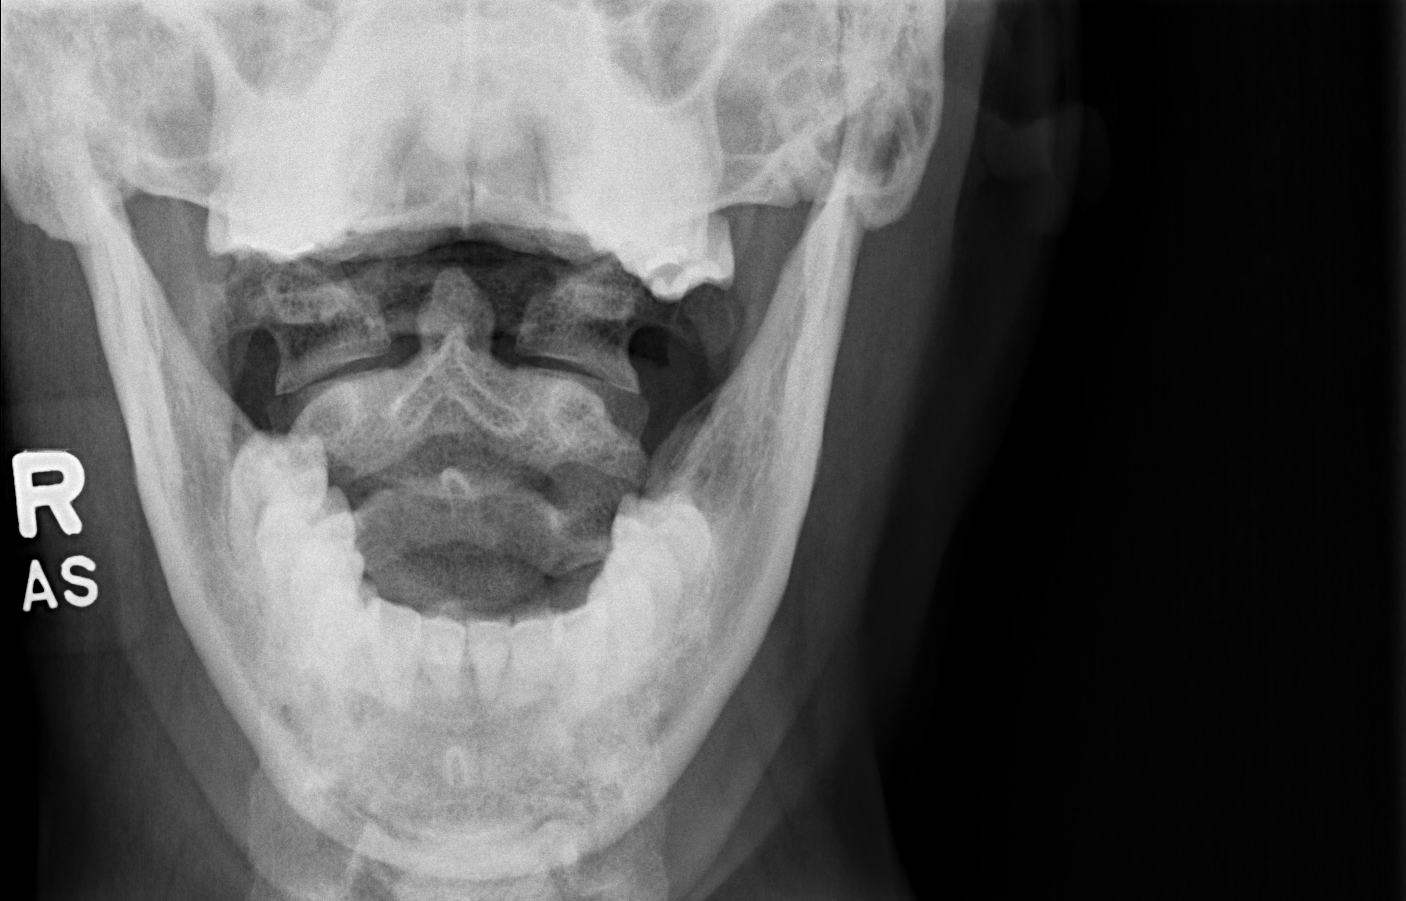

[3 of 3 positions shown; findings below may reference images not displayed]

FINDINGS: Pre odontoid space and prevertebral soft tissues are normal.
Reversal of normal lordosis centered at C5. No other malalignment.
Degenerative disc disease most prominent at C5-6 and C6-7. No other
bony abnormalities. Lung apices normal.
IMPRESSION: 1. Reversal of normal lordosis as above. Degenerative changes as
above.

## 2023-08-27 ENCOUNTER — Encounter (HOSPITAL_COMMUNITY): Payer: Self-pay | Admitting: Emergency Medicine

## 2023-08-27 ENCOUNTER — Other Ambulatory Visit: Payer: Self-pay

## 2023-08-27 ENCOUNTER — Emergency Department (HOSPITAL_COMMUNITY)
Admission: EM | Admit: 2023-08-27 | Discharge: 2023-08-27 | Disposition: A | Discharge status: Home / Self Care | Payer: Worker's Compensation | Attending: Emergency Medicine | Admitting: Emergency Medicine

## 2023-08-27 DIAGNOSIS — L299 Pruritus, unspecified: Secondary | ICD-10-CM | POA: Diagnosis present

## 2023-08-27 DIAGNOSIS — L509 Urticaria, unspecified: Secondary | ICD-10-CM | POA: Diagnosis not present

## 2023-08-27 MED ORDER — DIPHENHYDRAMINE HCL 25 MG PO TABS: 25.0000 mg | ORAL_TABLET | Freq: Four times a day (QID) | ORAL | 0 refills | Status: AC

## 2023-08-27 MED ORDER — PREDNISONE 20 MG PO TABS
60.0000 mg | ORAL_TABLET | Freq: Once | ORAL | Status: AC
  Filled 2023-08-27: qty 3

## 2023-08-27 MED ORDER — DIPHENHYDRAMINE HCL 25 MG PO CAPS
25.0000 mg | ORAL_CAPSULE | Freq: Once | ORAL | Status: AC
  Filled 2023-08-27: qty 1

## 2023-08-27 MED ORDER — PREDNISONE 10 MG PO TABS: 40.0000 mg | ORAL_TABLET | Freq: Every day | ORAL | 0 refills | Status: AC

## 2023-08-27 MED ORDER — FAMOTIDINE 20 MG PO TABS: 20.0000 mg | ORAL_TABLET | Freq: Two times a day (BID) | ORAL | 0 refills | Status: AC

## 2023-08-27 MED ORDER — FAMOTIDINE 20 MG PO TABS
20.0000 mg | ORAL_TABLET | Freq: Once | ORAL | Status: AC
  Filled 2023-08-27: qty 1

## 2023-12-31 ENCOUNTER — Encounter: Payer: Self-pay | Admitting: Physical Therapy
